# Patient Record
Sex: Female | Born: 1955 | Race: Black or African American | Hispanic: No | Smoking: Former smoker
Health system: Southern US, Community
[De-identification: ages and names within clinical notes are randomized; demographics above are authoritative.]

## PROBLEM LIST (undated history)

## (undated) DIAGNOSIS — M1990 Unspecified osteoarthritis, unspecified site: Secondary | ICD-10-CM

## (undated) DIAGNOSIS — I10 Essential (primary) hypertension: Secondary | ICD-10-CM

## (undated) HISTORY — PX: TUBAL LIGATION: SHX77

## (undated) HISTORY — DX: Unspecified osteoarthritis, unspecified site: M19.90

## (undated) HISTORY — DX: Essential (primary) hypertension: I10

---

## 1998-07-03 ENCOUNTER — Emergency Department (HOSPITAL_COMMUNITY): Admission: EM | Admit: 1998-07-03 | Discharge: 1998-07-03 | Payer: Self-pay | Admitting: Emergency Medicine

## 1999-02-16 ENCOUNTER — Emergency Department (HOSPITAL_COMMUNITY): Admission: EM | Admit: 1999-02-16 | Discharge: 1999-02-16 | Payer: Self-pay | Admitting: Emergency Medicine

## 1999-02-16 ENCOUNTER — Encounter: Payer: Self-pay | Admitting: Emergency Medicine

## 1999-03-02 ENCOUNTER — Encounter: Admission: RE | Admit: 1999-03-02 | Discharge: 1999-05-31 | Payer: Self-pay | Admitting: Family Medicine

## 1999-06-12 ENCOUNTER — Emergency Department (HOSPITAL_COMMUNITY): Admission: EM | Admit: 1999-06-12 | Discharge: 1999-06-12 | Payer: Self-pay | Admitting: Emergency Medicine

## 1999-09-03 ENCOUNTER — Other Ambulatory Visit: Admission: RE | Admit: 1999-09-03 | Discharge: 1999-09-03 | Payer: Self-pay | Admitting: Family Medicine

## 2000-05-15 ENCOUNTER — Emergency Department (HOSPITAL_COMMUNITY): Admission: EM | Admit: 2000-05-15 | Discharge: 2000-05-15 | Payer: Self-pay | Admitting: Emergency Medicine

## 2000-09-17 ENCOUNTER — Emergency Department (HOSPITAL_COMMUNITY): Admission: EM | Admit: 2000-09-17 | Discharge: 2000-09-17 | Payer: Self-pay | Admitting: Emergency Medicine

## 2000-09-17 ENCOUNTER — Encounter: Payer: Self-pay | Admitting: Emergency Medicine

## 2001-03-10 ENCOUNTER — Encounter: Payer: Self-pay | Admitting: Family Medicine

## 2001-03-10 ENCOUNTER — Encounter: Admission: RE | Admit: 2001-03-10 | Discharge: 2001-03-10 | Payer: Self-pay | Admitting: Family Medicine

## 2001-07-08 ENCOUNTER — Emergency Department (HOSPITAL_COMMUNITY): Admission: EM | Admit: 2001-07-08 | Discharge: 2001-07-08 | Payer: Self-pay | Admitting: Emergency Medicine

## 2001-10-02 ENCOUNTER — Emergency Department (HOSPITAL_COMMUNITY): Admission: EM | Admit: 2001-10-02 | Discharge: 2001-10-02 | Payer: Self-pay | Admitting: Emergency Medicine

## 2003-03-28 ENCOUNTER — Encounter: Payer: Self-pay | Admitting: Internal Medicine

## 2003-03-28 ENCOUNTER — Encounter: Admission: RE | Admit: 2003-03-28 | Discharge: 2003-03-28 | Payer: Self-pay | Admitting: Internal Medicine

## 2005-02-19 ENCOUNTER — Emergency Department (HOSPITAL_COMMUNITY): Admission: EM | Admit: 2005-02-19 | Discharge: 2005-02-19 | Payer: Self-pay | Admitting: Emergency Medicine

## 2005-12-17 ENCOUNTER — Emergency Department (HOSPITAL_COMMUNITY): Admission: EM | Admit: 2005-12-17 | Discharge: 2005-12-17 | Payer: Self-pay | Admitting: Emergency Medicine

## 2007-03-27 ENCOUNTER — Emergency Department (HOSPITAL_COMMUNITY): Admission: EM | Admit: 2007-03-27 | Discharge: 2007-03-27 | Payer: Self-pay | Admitting: Emergency Medicine

## 2007-10-23 ENCOUNTER — Encounter: Payer: Self-pay | Admitting: Family Medicine

## 2007-10-23 ENCOUNTER — Ambulatory Visit: Payer: Self-pay | Admitting: Family Medicine

## 2007-10-23 DIAGNOSIS — J309 Allergic rhinitis, unspecified: Secondary | ICD-10-CM

## 2007-10-23 DIAGNOSIS — M1990 Unspecified osteoarthritis, unspecified site: Secondary | ICD-10-CM | POA: Insufficient documentation

## 2007-10-23 DIAGNOSIS — I10 Essential (primary) hypertension: Secondary | ICD-10-CM

## 2007-10-23 LAB — CONVERTED CEMR LAB
ALT: 12 units/L (ref 0–35)
AST: 18 units/L (ref 0–37)
Albumin: 4.5 g/dL (ref 3.5–5.2)
Alkaline Phosphatase: 77 units/L (ref 39–117)
BUN: 8 mg/dL (ref 6–23)
CO2: 23 meq/L (ref 19–32)
Calcium: 9.4 mg/dL (ref 8.4–10.5)
Chloride: 105 meq/L (ref 96–112)
Cholesterol: 165 mg/dL (ref 0–200)
Creatinine, Ser: 0.78 mg/dL (ref 0.40–1.20)
Glucose, Bld: 96 mg/dL (ref 70–99)
HCT: 34.8 % — ABNORMAL LOW (ref 36.0–46.0)
HDL: 53 mg/dL (ref >39)
Hemoglobin: 11.3 g/dL — ABNORMAL LOW (ref 12.0–15.0)
LDL Cholesterol: 96 mg/dL (ref 0–99)
MCHC: 32.5 g/dL (ref 30.0–36.0)
MCV: 90.6 fL (ref 78.0–100.0)
Platelets: 378 10*3/uL (ref 150–400)
Potassium: 3.7 meq/L (ref 3.5–5.3)
RBC: 3.84 M/uL — ABNORMAL LOW (ref 3.87–5.11)
RDW: 13.2 % (ref 11.5–15.5)
Sodium: 141 meq/L (ref 135–145)
TSH: 1.226 microintl units/mL (ref 0.350–5.50)
Total Bilirubin: 0.6 mg/dL (ref 0.3–1.2)
Total CHOL/HDL Ratio: 3.1
Total Protein: 7.6 g/dL (ref 6.0–8.3)
Triglycerides: 81 mg/dL (ref <150)
VLDL: 16 mg/dL (ref 0–40)
WBC: 7 10*3/uL (ref 4.0–10.5)

## 2007-11-20 ENCOUNTER — Encounter (INDEPENDENT_AMBULATORY_CARE_PROVIDER_SITE_OTHER): Payer: Self-pay | Admitting: *Deleted

## 2008-02-02 ENCOUNTER — Encounter: Payer: Self-pay | Admitting: Family Medicine

## 2008-02-02 ENCOUNTER — Ambulatory Visit: Payer: Self-pay | Admitting: Family Medicine

## 2008-02-02 ENCOUNTER — Telehealth: Payer: Self-pay | Admitting: *Deleted

## 2008-02-02 LAB — CONVERTED CEMR LAB
Chlamydia, DNA Probe: NEGATIVE
GC Probe Amp, Genital: NEGATIVE
Whiff Test: POSITIVE

## 2008-02-04 ENCOUNTER — Encounter: Payer: Self-pay | Admitting: Family Medicine

## 2008-02-08 ENCOUNTER — Encounter: Payer: Self-pay | Admitting: Family Medicine

## 2008-03-31 ENCOUNTER — Ambulatory Visit: Payer: Self-pay | Admitting: Family Medicine

## 2008-08-15 ENCOUNTER — Ambulatory Visit: Payer: Self-pay | Admitting: Family Medicine

## 2008-08-25 ENCOUNTER — Encounter: Payer: Self-pay | Admitting: Family Medicine

## 2008-09-07 ENCOUNTER — Telehealth: Payer: Self-pay | Admitting: Family Medicine

## 2008-11-16 ENCOUNTER — Telehealth: Payer: Self-pay | Admitting: Family Medicine

## 2009-04-06 ENCOUNTER — Encounter: Payer: Self-pay | Admitting: Family Medicine

## 2009-04-06 ENCOUNTER — Ambulatory Visit: Payer: Self-pay | Admitting: Family Medicine

## 2009-09-04 ENCOUNTER — Ambulatory Visit: Payer: Self-pay | Admitting: Family Medicine

## 2009-10-02 ENCOUNTER — Telehealth: Payer: Self-pay | Admitting: Family Medicine

## 2009-11-24 ENCOUNTER — Encounter: Payer: Self-pay | Admitting: Family Medicine

## 2009-11-24 ENCOUNTER — Ambulatory Visit: Payer: Self-pay | Admitting: Family Medicine

## 2009-11-24 LAB — CONVERTED CEMR LAB
ALT: 13 units/L (ref 0–35)
AST: 16 units/L (ref 0–37)
Albumin: 4.7 g/dL (ref 3.5–5.2)
Alkaline Phosphatase: 94 units/L (ref 39–117)
BUN: 11 mg/dL (ref 6–23)
CO2: 25 meq/L (ref 19–32)
Calcium: 9.5 mg/dL (ref 8.4–10.5)
Chlamydia, DNA Probe: NEGATIVE
Chloride: 101 meq/L (ref 96–112)
Creatinine, Ser: 0.76 mg/dL (ref 0.40–1.20)
Direct LDL: 102 mg/dL — ABNORMAL HIGH
GC Probe Amp, Genital: NEGATIVE
Glucose, Bld: 83 mg/dL (ref 70–99)
Pap Smear: NEGATIVE
Potassium: 3.6 meq/L (ref 3.5–5.3)
Sodium: 138 meq/L (ref 135–145)
Total Bilirubin: 0.6 mg/dL (ref 0.3–1.2)
Total Protein: 7.8 g/dL (ref 6.0–8.3)
Whiff Test: POSITIVE

## 2009-11-28 ENCOUNTER — Encounter: Payer: Self-pay | Admitting: Family Medicine

## 2010-01-31 ENCOUNTER — Telehealth: Payer: Self-pay | Admitting: Family Medicine

## 2010-02-16 ENCOUNTER — Encounter: Payer: Self-pay | Admitting: Family Medicine

## 2010-07-17 NOTE — Progress Notes (Signed)
Summary: phn msg  Phone Note Call from Patient   Caller: Patient Summary of Call: Can't come in today due to children having virus. Initial call taken by: Clydell Hakim,  October 02, 2009 8:37 AM

## 2010-07-17 NOTE — Assessment & Plan Note (Signed)
Summary: cpe,tcb   Vital Signs:  Patient profile:   54 year old female Height:      62 inches Weight:      168.4 pounds BMI:     30.91 Pulse rate:   74 / minute BP sitting:   126 / 80  (right arm)  Vitals Entered By: Renato Battles slade,cma CC: fell on right knee 5 months ago. refill meds. f/up HTN Is Patient Diabetic? No Pain Assessment Patient in pain? yes     Location: right knee Intensity: 8 Onset of pain  x 5 months   Primary Care Provider:  Luretha Murphy NP  CC:  fell on right knee 5 months ago. refill meds. f/up HTN.  History of Present Illness: Pt here for health maintenance examination.  She feels well other than her right knee gives her pain when she goes up and down stairs.  She has a new boyfriend, she is very involved in her church.  Allergies have been bad, she has been using her nasal spray.  Habits & Providers  Alcohol-Tobacco-Diet     Tobacco Status: quit > 6 months     Tobacco Counseling: to remain off tobacco products  Current Medications (verified): 1)  Singulair 10 Mg  Tabs (Montelukast Sodium) .... Take One Tablet Daily 2)  Hyzaar 100-12.5 Mg  Tabs (Losartan Potassium-Hctz) .... Take One Tablet Daily 3)  Flonase 50 Mcg/act  Susp (Fluticasone Propionate) .... Two Squirts Daily 4)  Naprosyn 500 Mg Tabs (Naproxen) .... Take One Po Two Times A Day As Needed For Joint Pain  Allergies (verified): No Known Drug Allergies  Social History: Smoking Status:  quit > 6 months  Review of Systems General:  Complains of sweats; denies fatigue, loss of appetite, and sleep disorder. ENT:  Complains of sinus pressure. CV:  Denies chest pain or discomfort. Resp:  Denies cough and wheezing. GU:  Complains of discharge; denies dysuria. MS:  left knee pain. Psych:  Denies depression.  Physical Exam  General:  Alert, well developed AA female Eyes:  pupils equal, pupils round, pupils reactive to light, and no optic disk abnormalities.   Ears:  R ear normal and L  ear normal.   Nose:  no external erythema and no nasal discharge.   Mouth:  good dentition and posterior lymphoid hypertrophy.   Neck:  No deformities, masses, or tenderness noted. Breasts:  No mass, nodules, thickening, tenderness, bulging, retraction, inflamation, nipple discharge or skin changes noted.   Lungs:  normal respiratory effort and normal breath sounds.   Heart:  normal rate, regular rhythm, and no murmur.   Abdomen:  soft and non-tender.   Genitalia:  Normal introitus for age, no external lesions, no vaginal discharge, mucosa pink and moist, no vaginal or cervical lesions, no vaginal atrophy, no friaility or hemorrhage, normal uterus size and position, no adnexal masses or tenderness Msk:  normal ROM.  Right knee:  no effusion, tender along medial joint line, good stability.  Weak hamstrings Pulses:  R and L carotid,radial,femoral,dorsalis pedis and posterior tibial pulses are full and equal bilaterally Skin:  Intact without suspicious lesions or rashes Axillary Nodes:  No palpable lymphadenopathy Psych:  Cognition and judgment appear intact. Alert and cooperative with normal attention span and concentration. No apparent delusions, illusions, hallucinations   Impression & Recommendations:  Problem # 1:  PREVENTIVE HEALTH CARE (ICD-V70.0)  Prevention reviewed, patient given information to schedule her own mammogram and colonoscopy, Tdap given, PAP done.  Orders: [facility] - Est  40-64  yrs [phone])  Problem # 2:  CONTACT OR EXPOSURE TO OTHER VIRAL DISEASES (ICD-V01.79) New sexual partner Orders: GC/Chlamydia-[facility] ([id number]) Wet Prep- [facility] [phone]) HIV-[facility] [phone])  Problem # 3:  HYPERTENSION (ICD-401.9)  Her updated medication list for this problem includes:    Hyzaar 100-12.5 Mg Tabs (Losartan potassium-hctz) .Marland Kitchen... Take one tablet daily  Problem # 4:  ALLERGIC RHINITIS (ICD-477.9)  Her updated medication list for this problem includes:    Flonase 50 Mcg/act Susp  (Fluticasone propionate) .Marland Kitchen..Marland Kitchen Two squirts daily  Problem # 5:  NEED PROPHYLACTIC VACCINATION&INOCULATION FLU (ICD-V04.81) right knee pain:  recommended weight loss, NSAIDS, and stregthening exercises   Complete Medication List: 1)  Singulair 10 Mg Tabs (Montelukast sodium) .... Take one tablet daily 2)  Hyzaar 100-12.5 Mg Tabs (Losartan potassium-hctz) .... Take one tablet daily 3)  Flonase 50 Mcg/act Susp (Fluticasone propionate) .... Two squirts daily 4)  Naprosyn 500 Mg Tabs (Naproxen) .... Take one po two times a day as needed for joint pain  Other Orders: Direct LDL-[facility] [phone]) Comp Met-[facility] [phone]) Colonoscopy (Colon) Pap Smear-[facility] ([id number]) Tdap => 39yrs IM (70623) Admin 1st Vaccine (76283) Admin 1st Vaccine Horticulturist, commercial) [phone])  Patient Instructions: 1)  It is important to schedlue your colonoscopy and have your mammogram they are both screening tests for common cancers that we know if found early have a good results 2)  Exercise regularly 3)  Keep your weight down, try to lose 10 pounds in the next year by walking and eating healthy Prescriptions: NAPROSYN 500 MG TABS (NAPROXEN) take one po two times a day as needed for joint pain Brand medically necessary #100 x 3   Entered and Authorized by:   Luretha Murphy NP   Signed by:   Luretha Murphy NP on 11/24/2009   Method used:   Print then Give to Patient   RxID:   [id number] FLONASE 50 MCG/ACT  SUSP (FLUTICASONE PROPIONATE) two squirts daily Brand medically necessary #3 x 3   Entered and Authorized by:   Luretha Murphy NP   Signed by:   Luretha Murphy NP on 11/24/2009   Method used:   Print then Give to Patient   RxID:   [id number] HYZAAR 100-12.5 MG  TABS (LOSARTAN POTASSIUM-HCTZ) take one tablet daily Brand medically necessary #90 x 3   Entered and Authorized by:   Luretha Murphy NP   Signed by:   Luretha Murphy NP on 11/24/2009   Method used:   Print then Give to Patient   RxID:   [id number] SINGULAIR 10  MG  TABS (MONTELUKAST SODIUM) take one tablet daily Brand medically necessary #90 x 3   Entered and Authorized by:   Luretha Murphy NP   Signed by:   Luretha Murphy NP on 11/24/2009   Method used:   Print then Give to Patient   RxID:   [id number]    Prevention & Chronic Care Immunizations   Influenza vaccine: Fluvax Non-MCR  (04/06/2009)   Influenza vaccine due: 02/15/2010    Tetanus booster: 11/24/2009: Tdap    Pneumococcal vaccine: Not documented  Colorectal Screening   Hemoccult: Not documented   Hemoccult action/deferral: Not indicated  (11/24/2009)    Colonoscopy: Not documented   Colonoscopy action/deferral: GI Referral  (11/24/2009)  Other Screening   Pap smear: Normal  (02/08/2008)   Pap smear action/deferral: Ordered  (11/24/2009)   Pap smear due: 02/2009    Mammogram: Not documented   Smoking status: quit > 6 months  (11/24/2009)  Lipids   Total  Cholesterol: 165  (10/23/2007)   LDL: 96  (10/23/2007)   LDL Direct: Not documented   HDL: 53  (10/23/2007)   Triglycerides: 81  (10/23/2007)  Hypertension   Last Blood Pressure: 126 / 80  (11/24/2009)   Serum creatinine: 0.78  (10/23/2007)   Serum potassium 3.7  (10/23/2007) CMP ordered   Self-Management Support :    Hypertension self-management support: Not documented   Nursing Instructions: Screening colonoscopy ordered Pap smear today   Laboratory Results  Date/Time Received: November 24, 2009 10:18 AM  Date/Time Reported: November 24, 2009 10:38 AM   Wet [facility] Source: vag WBC/hpf: rare Bacteria/hpf: 2+  Cocci Clue cells/hpf: moderate  Positive whiff Yeast/hpf: none Trichomonas/hpf: none Comments: ...............test performed by......Marland KitchenBonnie A. Sw[place], MLS (ASCP)cm      Tetanus/Td Vaccine    Vaccine Type: Tdap    Site: left deltoid    Mfr: boostrix    Dose: 0.5 ml    Route: IM    Given by: Arlyss Repress CMA,    Exp. Date: 09/08/2011    Lot #: [id number]    VIS given: 05/05/07  version given November 24, 2009.

## 2010-07-17 NOTE — Letter (Signed)
Summary: Generic Letter  [facility]  [address]   [place], [place] [address]   Phone: [phone]  Fax: [phone]    11/28/2009  Melissa Lloyd [address] [place], [place]  [address]  Dear [name],   All of your test and labs are normal.       Sincerely,   Luretha Murphy NP  Appended Document: Generic Letter mailed.

## 2010-07-17 NOTE — Miscellaneous (Signed)
  Clinical Lists Changes  Medications: Rx of SINGULAIR 10 MG  TABS (MONTELUKAST SODIUM) take one tablet daily;  #30 x 6;  Signed;  Entered by: Luretha Murphy NP;  Authorized by: Luretha Murphy NP;  Method used: Print then Give to Patient Rx of HYZAAR 100-12.5 MG  TABS (LOSARTAN POTASSIUM-HCTZ) take one tablet daily;  #30 x 6;  Signed;  Entered by: Luretha Murphy NP;  Authorized by: Luretha Murphy NP;  Method used: Print then Give to Patient Rx of FLONASE 50 MCG/ACT  SUSP (FLUTICASONE PROPIONATE) two squirts daily;  #1 x 6;  Signed;  Entered by: Luretha Murphy NP;  Authorized by: Luretha Murphy NP;  Method used: Print then Give to Patient Rx of NAPROSYN 500 MG TABS (NAPROXEN) take one po two times a day as needed for joint pain;  #60 x 6;  Signed;  Entered by: Luretha Murphy NP;  Authorized by: Luretha Murphy NP;  Method used: Print then Give to Patient    Prescriptions: NAPROSYN 500 MG TABS (NAPROXEN) take one po two times a day as needed for joint pain  #60 x 6   Entered and Authorized by:   Luretha Murphy NP   Signed by:   Luretha Murphy NP on 02/16/2010   Method used:   Print then Give to Patient   RxID:   [id number] FLONASE 50 MCG/ACT  SUSP (FLUTICASONE PROPIONATE) two squirts daily  #1 x 6   Entered and Authorized by:   Luretha Murphy NP   Signed by:   Luretha Murphy NP on 02/16/2010   Method used:   Print then Give to Patient   RxID:   [id number] LKGMWN 100-12.5 MG  TABS (LOSARTAN POTASSIUM-HCTZ) take one tablet daily  #30 x 6   Entered and Authorized by:   Luretha Murphy NP   Signed by:   Luretha Murphy NP on 02/16/2010   Method used:   Print then Give to Patient   RxID:   [id number] SINGULAIR 10 MG  TABS (MONTELUKAST SODIUM) take one tablet daily  #30 x 6   Entered and Authorized by:   Luretha Murphy NP   Signed by:   Luretha Murphy NP on 02/16/2010   Method used:   Print then Give to Patient   RxID:   [id number]

## 2010-07-17 NOTE — Assessment & Plan Note (Signed)
Summary: sore throat,df   Vital Signs:  Patient profile:   55 year old female Weight:      172.6 pounds Temp:     98.3 degrees F oral Pulse rate:   96 / minute Pulse rhythm:   regular BP sitting:   152 / 92  Vitals Entered By: Loralee Pacas CMA (September 04, 2009 2:33 PM)  CC:  Sore Throat.  Acute Visit History:      The patient complains of sore throat.  These symptoms began 3 days ago.  She denies fever, nasal discharge, and rash.  Other comments include: grandson with strep. no other symptoms. no difficulty swallowing. .        She has had dysphagia associated with the sore throat.  There has been a recent exposure to strep.  There is no history of drooling or cold/URI symptoms.        Current Medications (verified): 1)  Singulair 10 Mg  Tabs (Montelukast Sodium) .... Take One Tablet Daily 2)  Hyzaar 100-12.5 Mg  Tabs (Losartan Potassium-Hctz) .... Take One Tablet Daily 3)  Flonase 50 Mcg/act  Susp (Fluticasone Propionate) .... Two Squirts Daily 4)  Naprosyn 500 Mg Tabs (Naproxen) .... Take One P0 Two Times A Day X 10 Days Then As Needed  Allergies (verified): No Known Drug Allergies  Physical Exam  General:  NAD, vitals reviewed.  Nose:  no rhinorrhea Mouth:  mild erythema of posterior oropharynx. no exudates.  Neck:  no lymphadenopathy Lungs:  Normal respiratory effort, chest expands symmetrically. Lungs are clear to auscultation, no crackles or wheezes.   Impression & Recommendations:  Problem # 1:  SORE THROAT (ICD-462) Assessment New  given strep exposure, will tx presumptively for strep throat. f/u as needed if symptoms don't improve.   Her updated medication list for this problem includes:    Naprosyn 500 Mg Tabs (Naproxen) .Marland Kitchen... Take one p0 two times a day x 10 days then as needed  Orders: [facility]- Est Level  3 ([address])  Patient Instructions: 1)  Schedule an appointment with Darl Pikes regarding your chronic problems, like your blood pressure.   Prescriptions: NAPROSYN 500 MG TABS (NAPROXEN) take one p0 two times a day x 10 days then as needed  #30 x 0   Entered and Authorized by:   Lequita Asal  MD   Signed by:   Lequita Asal  MD on 09/04/2009   Method used:   [facility] to        [facility] [phone]* (retail)       [address], [place]  [address]       Ph: [phone] or [phone]       Fax: [phone]   RxID:   [id number]   Appended Document: sore throat,df     Allergies: No Known Drug Allergies   Complete Medication List: 1)  Singulair 10 Mg Tabs (Montelukast sodium) .... Take one tablet daily 2)  Hyzaar 100-12.5 Mg Tabs (Losartan potassium-hctz) .... Take one tablet daily 3)  Flonase 50 Mcg/act Susp (Fluticasone propionate) .... Two squirts daily 4)  Naprosyn 500 Mg Tabs (Naproxen) .... Take one p0 two times a day x 10 days then as needed    Medication Administration  Injection # 1:    Medication: Bicillin LA 1.2 million units Injection    Diagnosis: SORE THROAT (ICD-462)    Route: IM    Site: RUOQ gluteus    Exp Date: 05/17/2012  Lot #: [id number]    Mfr: [employer]    Patient tolerated injection without complications    Given by: Loralee Pacas CMA (September 04, 2009 6:10 PM)

## 2010-07-17 NOTE — Progress Notes (Signed)
Summary: Rx Req  Phone Note Refill Request Call back at Home Phone [phone] Message from:  Patient  Refills Requested: Medication #1:  SINGULAIR 10 MG  TABS take one tablet daily [BMN]  Medication #2:  NAPROSYN 500 MG TABS take one po two times a day as needed for joint pain [BMN].  Medication #3:  HYZAAR 100-12.5 MG  TABS take one tablet daily [BMN]  Medication #4:  FLONASE 50 MCG/ACT  SUSP two squirts daily [BMN] PT HAS MISPLACED ALL HER SCRIPTS AND CAN SHE GET THESE MAILED TO HER.  Initial call taken by: Clydell Hakim,  January 31, 2010 12:25 PM    Prescriptions: NAPROSYN 500 MG TABS (NAPROXEN) take one po two times a day as needed for joint pain Brand medically necessary #100 x 3   Entered and Authorized by:   Zachery Dauer MD   Signed by:   Zachery Dauer MD on 01/31/2010   Method used:   Print then Give to Patient   RxID:   [id number] FLONASE 50 MCG/ACT  SUSP (FLUTICASONE PROPIONATE) two squirts daily Brand medically necessary #3 x 3   Entered and Authorized by:   Zachery Dauer MD   Signed by:   Zachery Dauer MD on 01/31/2010   Method used:   Print then Give to Patient   RxID:   [id number] SINGULAIR 10 MG  TABS (MONTELUKAST SODIUM) take one tablet daily Brand medically necessary #90 x 3   Entered and Authorized by:   Zachery Dauer MD   Signed by:   Zachery Dauer MD on 01/31/2010   Method used:   Print then Give to Patient   RxID:   [id number] HYZAAR 100-12.5 MG  TABS (LOSARTAN POTASSIUM-HCTZ) take one tablet daily Brand medically necessary #90 x 3   Entered and Authorized by:   Zachery Dauer MD   Signed by:   Zachery Dauer MD on 01/31/2010   Method used:   Print then Give to Patient   RxID:   [id number]   Appended Document: Rx Req placed in mail and patient notified.

## 2010-07-25 ENCOUNTER — Encounter: Payer: Self-pay | Admitting: *Deleted

## 2010-08-30 ENCOUNTER — Other Ambulatory Visit: Payer: Self-pay | Admitting: Family Medicine

## 2010-08-30 MED ORDER — FLUTICASONE PROPIONATE 50 MCG/ACT NA SUSP: 2.0000 | Freq: Every day | NASAL | Status: DC

## 2010-08-30 MED ORDER — LOSARTAN POTASSIUM-HCTZ 100-12.5 MG PO TABS: 1.0000 | ORAL_TABLET | Freq: Every day | ORAL | Status: DC

## 2010-08-30 MED ORDER — NAPROXEN 500 MG PO TABS: 500.0000 mg | ORAL_TABLET | Freq: Two times a day (BID) | ORAL | Status: DC | PRN

## 2010-08-30 MED ORDER — MONTELUKAST SODIUM 10 MG PO TABS: 10.0000 mg | ORAL_TABLET | Freq: Every day | ORAL | Status: DC

## 2010-08-30 NOTE — Telephone Encounter (Signed)
Mother here for son's visit and requesting refills.  Will fill for 1 month's worth. Mother told to make appointment with Darl Pikes within next 1-2 weeks.

## 2010-09-24 ENCOUNTER — Telehealth: Payer: Self-pay | Admitting: Family Medicine

## 2010-09-24 MED ORDER — LOSARTAN POTASSIUM-HCTZ 100-12.5 MG PO TABS: 1.0000 | ORAL_TABLET | Freq: Every day | ORAL | Status: DC

## 2010-09-24 NOTE — Telephone Encounter (Signed)
Was supposed to have meds refill and sent to [facility] and hasn't heard anything yet.  Not sure if they got sent in.  Patient is now out of her BP meds.

## 2010-09-24 NOTE — Telephone Encounter (Signed)
Will route to her PCP. 

## 2010-09-24 NOTE — Telephone Encounter (Signed)
[facility] [address] Will send in 3 months supply then needs a visit

## 2010-10-30 ENCOUNTER — Ambulatory Visit (INDEPENDENT_AMBULATORY_CARE_PROVIDER_SITE_OTHER): Admitting: Family Medicine

## 2010-10-30 ENCOUNTER — Encounter: Payer: Self-pay | Admitting: Family Medicine

## 2010-10-30 VITALS — BP 126/80 | HR 82 | Temp 98.0°F | Ht 61.0 in | Wt 166.0 lb

## 2010-10-30 DIAGNOSIS — A599 Trichomoniasis, unspecified: Secondary | ICD-10-CM | POA: Insufficient documentation

## 2010-10-30 DIAGNOSIS — N760 Acute vaginitis: Secondary | ICD-10-CM

## 2010-10-30 LAB — POCT WET PREP (WET MOUNT): Clue Cells Wet Prep HPF POC: NEGATIVE

## 2010-10-30 MED ORDER — FLUTICASONE PROPIONATE 50 MCG/ACT NA SUSP: 2.0000 | Freq: Every day | NASAL | Status: DC

## 2010-10-30 MED ORDER — MONTELUKAST SODIUM 10 MG PO TABS: 10.0000 mg | ORAL_TABLET | Freq: Every day | ORAL | Status: DC

## 2010-10-30 MED ORDER — LOSARTAN POTASSIUM-HCTZ 100-12.5 MG PO TABS: 1.0000 | ORAL_TABLET | Freq: Every day | ORAL | Status: DC

## 2010-10-30 MED ORDER — METRONIDAZOLE 500 MG PO TABS: ORAL_TABLET | ORAL | Status: DC

## 2010-10-30 MED ORDER — NAPROXEN 500 MG PO TABS: 500.0000 mg | ORAL_TABLET | Freq: Two times a day (BID) | ORAL | Status: DC | PRN

## 2010-10-30 NOTE — Assessment & Plan Note (Signed)
2 GM dose of metronidazole po, await GC and CT cultures

## 2010-10-30 NOTE — Progress Notes (Signed)
  Subjective:    Patient ID: Melissa Lloyd, female    DOB: [birth year], 55 y.o.   MRN: [id number]  HPI Large amount of vaginal discharge and itching.  One sexual partner, does not use condoms.   Review of Systems  Constitutional: Negative for fever and chills.  Gastrointestinal: Negative for abdominal distention.  Genitourinary: Positive for vaginal discharge. Negative for vaginal bleeding, vaginal pain and pelvic pain.       Objective:   Physical Exam  Genitourinary:       Copious amounts of green discharge in vaginal vault, + trich          Assessment & Plan:

## 2010-10-30 NOTE — Patient Instructions (Signed)
Please schedule Melissa Lloyd this Friday afternoon for a asthma visit

## 2010-10-31 ENCOUNTER — Encounter: Payer: Self-pay | Admitting: Family Medicine

## 2010-10-31 LAB — GC/CHLAMYDIA PROBE AMP, GENITAL: Chlamydia, DNA Probe: NEGATIVE

## 2010-11-06 ENCOUNTER — Encounter: Payer: Self-pay | Admitting: Family Medicine

## 2010-11-06 ENCOUNTER — Ambulatory Visit (INDEPENDENT_AMBULATORY_CARE_PROVIDER_SITE_OTHER): Admitting: Family Medicine

## 2010-11-06 VITALS — BP 134/88 | HR 91 | Temp 98.0°F | Ht 61.0 in | Wt 168.0 lb

## 2010-11-06 DIAGNOSIS — A599 Trichomoniasis, unspecified: Secondary | ICD-10-CM

## 2010-11-06 LAB — POCT WET PREP (WET MOUNT)
Trichomonas Wet Prep HPF POC: NEGATIVE
Yeast Wet Prep HPF POC: NEGATIVE

## 2010-11-06 MED ORDER — METRONIDAZOLE 500 MG PO TABS: ORAL_TABLET | ORAL | Status: AC
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
## 2010-11-06 NOTE — Patient Instructions (Signed)
Will retreat. 

## 2011-03-13 ENCOUNTER — Emergency Department (HOSPITAL_COMMUNITY)
Admission: EM | Admit: 2011-03-13 | Discharge: 2011-03-13 | Disposition: A | Discharge status: Home / Self Care | Attending: Emergency Medicine | Admitting: Emergency Medicine

## 2011-03-13 DIAGNOSIS — N898 Other specified noninflammatory disorders of vagina: Secondary | ICD-10-CM | POA: Insufficient documentation

## 2011-03-13 DIAGNOSIS — N72 Inflammatory disease of cervix uteri: Secondary | ICD-10-CM | POA: Insufficient documentation

## 2011-03-13 DIAGNOSIS — I10 Essential (primary) hypertension: Secondary | ICD-10-CM | POA: Insufficient documentation

## 2011-03-13 DIAGNOSIS — Z79899 Other long term (current) drug therapy: Secondary | ICD-10-CM | POA: Insufficient documentation

## 2011-03-13 DIAGNOSIS — R109 Unspecified abdominal pain: Secondary | ICD-10-CM | POA: Insufficient documentation

## 2011-03-13 DIAGNOSIS — R10819 Abdominal tenderness, unspecified site: Secondary | ICD-10-CM | POA: Insufficient documentation

## 2011-03-13 LAB — CBC
Hemoglobin: 12.7 g/dL (ref 12.0–15.0)
MCHC: 34 g/dL (ref 30.0–36.0)
RBC: 4.17 MIL/uL (ref 3.87–5.11)

## 2011-03-13 LAB — URINALYSIS, ROUTINE W REFLEX MICROSCOPIC
Ketones, ur: NEGATIVE mg/dL
Nitrite: NEGATIVE
Urobilinogen, UA: 1 mg/dL (ref 0.0–1.0)
pH: 5.5 (ref 5.0–8.0)

## 2011-03-13 LAB — URINE MICROSCOPIC-ADD ON

## 2011-03-13 LAB — DIFFERENTIAL
Basophils Absolute: 0 10*3/uL (ref 0.0–0.1)
Basophils Relative: 0 % (ref 0–1)
Monocytes Absolute: 1.3 10*3/uL — ABNORMAL HIGH (ref 0.1–1.0)
Neutro Abs: 8.3 10*3/uL — ABNORMAL HIGH (ref 1.7–7.7)
Neutrophils Relative %: 69 % (ref 43–77)

## 2011-03-13 LAB — WET PREP, GENITAL: Clue Cells Wet Prep HPF POC: NONE SEEN

## 2011-03-13 LAB — PREGNANCY, URINE: Preg Test, Ur: NEGATIVE

## 2011-03-13 LAB — POCT I-STAT, CHEM 8
Hemoglobin: 13.9 g/dL (ref 12.0–15.0)
Sodium: 136 mEq/L (ref 135–145)
TCO2: 26 mmol/L (ref 0–100)

## 2011-03-14 LAB — GC/CHLAMYDIA PROBE AMP, GENITAL
Chlamydia, DNA Probe: NEGATIVE
GC Probe Amp, Genital: NEGATIVE

## 2011-05-15 ENCOUNTER — Telehealth: Payer: Self-pay | Admitting: Family Medicine

## 2011-05-15 DIAGNOSIS — I10 Essential (primary) hypertension: Secondary | ICD-10-CM

## 2011-05-15 NOTE — Telephone Encounter (Signed)
[facility] - Fax [phone]/[place] - needs a refill of her BP medication.  She was told she would need an appointment to get this refill since is has been since 5/12 when she was last seen.  She will be coming in tomorrow.

## 2011-05-16 ENCOUNTER — Ambulatory Visit

## 2011-05-21 ENCOUNTER — Ambulatory Visit: Admitting: Family Medicine

## 2011-06-07 MED ORDER — LOSARTAN POTASSIUM-HCTZ 100-12.5 MG PO TABS: 1.0000 | ORAL_TABLET | Freq: Every day | ORAL | Status: DC

## 2011-06-07 NOTE — Telephone Encounter (Signed)
Has this been addressed?

## 2011-07-12 ENCOUNTER — Ambulatory Visit: Admitting: Family Medicine

## 2011-08-06 ENCOUNTER — Ambulatory Visit: Admitting: Family Medicine

## 2011-09-16 ENCOUNTER — Other Ambulatory Visit (HOSPITAL_COMMUNITY)
Admission: RE | Admit: 2011-09-16 | Discharge: 2011-09-16 | Disposition: A | Discharge status: Home / Self Care | Source: Ambulatory Visit | Attending: Family Medicine | Admitting: Family Medicine

## 2011-09-16 ENCOUNTER — Encounter: Payer: Self-pay | Admitting: Family Medicine

## 2011-09-16 ENCOUNTER — Ambulatory Visit (INDEPENDENT_AMBULATORY_CARE_PROVIDER_SITE_OTHER): Admitting: Family Medicine

## 2011-09-16 VITALS — BP 136/80 | HR 80 | Ht 61.0 in | Wt 168.4 lb

## 2011-09-16 DIAGNOSIS — Z01419 Encounter for gynecological examination (general) (routine) without abnormal findings: Secondary | ICD-10-CM | POA: Insufficient documentation

## 2011-09-16 DIAGNOSIS — I10 Essential (primary) hypertension: Secondary | ICD-10-CM

## 2011-09-16 DIAGNOSIS — Z1159 Encounter for screening for other viral diseases: Secondary | ICD-10-CM | POA: Insufficient documentation

## 2011-09-16 DIAGNOSIS — M1990 Unspecified osteoarthritis, unspecified site: Secondary | ICD-10-CM

## 2011-09-16 DIAGNOSIS — R5383 Other fatigue: Secondary | ICD-10-CM

## 2011-09-16 DIAGNOSIS — Z113 Encounter for screening for infections with a predominantly sexual mode of transmission: Secondary | ICD-10-CM | POA: Insufficient documentation

## 2011-09-16 DIAGNOSIS — Z202 Contact with and (suspected) exposure to infections with a predominantly sexual mode of transmission: Secondary | ICD-10-CM

## 2011-09-16 DIAGNOSIS — Z0000 Encounter for general adult medical examination without abnormal findings: Secondary | ICD-10-CM

## 2011-09-16 DIAGNOSIS — R5381 Other malaise: Secondary | ICD-10-CM

## 2011-09-16 DIAGNOSIS — N950 Postmenopausal bleeding: Secondary | ICD-10-CM

## 2011-09-16 LAB — CBC WITH DIFFERENTIAL/PLATELET
Basophils Absolute: 0 10*3/uL (ref 0.0–0.1)
Basophils Relative: 0 % (ref 0–1)
Eosinophils Absolute: 0.3 10*3/uL (ref 0.0–0.7)
Eosinophils Relative: 3 % (ref 0–5)
HCT: 40.2 % (ref 36.0–46.0)
Hemoglobin: 12.7 g/dL (ref 12.0–15.0)
Lymphocytes Relative: 30 % (ref 12–46)
Lymphs Abs: 2.5 10*3/uL (ref 0.7–4.0)
MCH: 29.3 pg (ref 26.0–34.0)
MCHC: 31.6 g/dL (ref 30.0–36.0)
MCV: 92.8 fL (ref 78.0–100.0)
Monocytes Absolute: 0.7 10*3/uL (ref 0.1–1.0)
Monocytes Relative: 9 % (ref 3–12)
Neutro Abs: 5 10*3/uL (ref 1.7–7.7)
Neutrophils Relative %: 59 % (ref 43–77)
Platelets: 359 10*3/uL (ref 150–400)
RBC: 4.33 MIL/uL (ref 3.87–5.11)
RDW: 13.2 % (ref 11.5–15.5)
WBC: 8.4 10*3/uL (ref 4.0–10.5)

## 2011-09-16 LAB — COMPREHENSIVE METABOLIC PANEL
ALT: 23 U/L (ref 0–35)
AST: 21 U/L (ref 0–37)
Alkaline Phosphatase: 80 U/L (ref 39–117)
CO2: 25 mEq/L (ref 19–32)
Sodium: 139 mEq/L (ref 135–145)
Total Bilirubin: 0.5 mg/dL (ref 0.3–1.2)
Total Protein: 7.8 g/dL (ref 6.0–8.3)

## 2011-09-16 LAB — HIV ANTIBODY (ROUTINE TESTING W REFLEX): HIV: NONREACTIVE

## 2011-09-16 MED ORDER — NAPROXEN 500 MG PO TABS: 500.0000 mg | ORAL_TABLET | Freq: Two times a day (BID) | ORAL | Status: DC

## 2011-09-16 MED ORDER — FLUTICASONE PROPIONATE 50 MCG/ACT NA SUSP: 2.0000 | Freq: Every day | NASAL | Status: DC

## 2011-09-16 MED ORDER — LOSARTAN POTASSIUM-HCTZ 100-12.5 MG PO TABS: 1.0000 | ORAL_TABLET | Freq: Every day | ORAL | Status: DC

## 2011-09-16 MED ORDER — MONTELUKAST SODIUM 10 MG PO TABS: 10.0000 mg | ORAL_TABLET | Freq: Every day | ORAL | Status: DC

## 2011-09-16 NOTE — Assessment & Plan Note (Addendum)
Continue losartan and hydrochlorothiazide 100/12.5 daily. Will check potassium Given that it was low in 2012. Will also check creatinine. If potassium is low consider testing supplements. Will plan on addressing diet and exercise at next visit.

## 2011-09-16 NOTE — Assessment & Plan Note (Signed)
Patient takes naproxen occasionally. Will check creatinine to make sure that NSAIDs are not affecting her kidney.

## 2011-09-16 NOTE — Progress Notes (Signed)
Patient ID: Melissa Lloyd, female   DOB: 05-13-1956, 56 y.o.   MRN: [id number] Patient ID: Melissa Lloyd    DOB: [birth year], 56 y.o.   MRN: [id number] --- Subjective:  Melissa Lloyd is a 56 y.o.female with h/o HTN and osteoarthritis who presents for physical exam. Concerns include the following - vaginal spotting in February. Only one day. Went through menopause in her late 54s. Denies any recent weight loss. Denies any family history of female cancers including uterine.  Menarche started at age 46. Does complain of fatigue. - History of sexually transmitted diseases: Had been screened for STDs in September 2012 because of concerns of sexually transmitted diseases from her partner. Had been positive for trichomoniasis at the time. Negative GC Chlamydia. Patient would like screening test for sexually transmitted diseases today. Denies any discharge, any vaginal itching or odor.She is still with the same partner but thinks that things have improved. - Hypertension: Takes her medication every day. Does not check her blood pressure home. Denies any chest pain shortness of breath lower extremity swelling or headache or change in vision.  Objective: Filed Vitals:   09/16/11 1048  BP: 136/80  Pulse: 80   Physical Examination:   General appearance - alert, well appearing, and in no distress Mouth - mucous membranes moist, pharynx normal without lesions Neck - supple, no significant adenopathy Chest - clear to auscultation, no wheezes, rales or rhonchi, symmetric air entry Heart - normal rate, regular rhythm, normal S1, S2, no murmurs, rubs, clicks or gallops Abdomen - soft, nontender, nondistended, no masses or organomegaly Extremities - peripheral pulses normal, no pedal edema, no clubbing or cyanosis Female genitalia: Vagina: normal appearing vagina with normal color and discharge, no lesions Cervix: blood present at os when brush was used for pap smear Uterus: uterus is normal size, shape, consistency and  nontender Adnexa: no masses and nontender

## 2011-09-16 NOTE — Patient Instructions (Addendum)
It was great meeting you today!   We are going to test blood work for sexually transmitted diseases, including HIV, syphillis, hepatitis B.   For the vaginal bleeding, like we talked about, I would like for you to get an "endometrial biopsy" to evaluate your bleeding further.   I will call you with the lab results.  We are also checking your potassium and kidney function since your blood pressure medications can cause low potassium.

## 2011-09-16 NOTE — Assessment & Plan Note (Signed)
Will call to get colonoscopy and will also schedule mammogram.

## 2011-09-16 NOTE — Assessment & Plan Note (Signed)
Will check RPR, HIV, hep B, herpes, GC Chlamydia given that patient has concerned about sexually transmitted diseases by her partner.

## 2011-09-16 NOTE — Assessment & Plan Note (Signed)
History of swollen day of spotting in a postmenopausal woman is concerning for endometrial cancer. Recommended to patient that she gets a followup appointment to get an endometrial biopsy. Patient agreed. On exam there was some bleeding with manipulation of the cervix. Followup on Pap results. Follow up on CBC to make sure that she is not anemic from this.

## 2011-09-17 LAB — HSV(HERPES SIMPLEX VRS) I + II AB-IGG: HSV 2 Glycoprotein G Ab, IgG: 7.43 IV — ABNORMAL HIGH

## 2011-09-20 ENCOUNTER — Telehealth: Payer: Self-pay | Admitting: Family Medicine

## 2011-09-20 NOTE — Telephone Encounter (Signed)
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 Explained that she doesn't have any evidence of active disease but that she at some point was infected with the virus. Advised for her to regularly check for lesions and come back to clinic to talk about treatment options. Currently no need to treat. Also informed her of negative HIV, RPR, Hep B and GC/Chl results. Patient understood.

## 2011-09-26 ENCOUNTER — Ambulatory Visit

## 2011-10-03 ENCOUNTER — Telehealth: Payer: Self-pay | Admitting: Family Medicine

## 2011-10-03 ENCOUNTER — Telehealth: Payer: Self-pay | Admitting: *Deleted

## 2011-10-03 NOTE — Telephone Encounter (Signed)
Called patient and left message to call our office back.  Mita Vallo Ann, RN  

## 2011-10-03 NOTE — Telephone Encounter (Signed)
Fwd. To PCP for refill. .Melissa Lloyd  

## 2011-10-03 NOTE — Telephone Encounter (Signed)
Needs to send [facility] by fax to fax # [phone] Needs to have her DOB & last 4 social 0430 pls resend the last 3 meds to them.

## 2011-10-03 NOTE — Telephone Encounter (Signed)
Pt called back and an appt was made in [facility] for endo bx on 5/9

## 2011-10-03 NOTE — Telephone Encounter (Signed)
Message copied by Jose Persia on Thu Oct 03, 2011 11:22 AM ------      Message from: Marena Chancy E      Created: Wed Oct 02, 2011  5:35 PM       Hi Lattie Corns,      Thank you for your message. If she is not coming to the appointments, it makes sense to make her a same day appointment. The only thing I am concerned about is that she does need an endometrial biopsy done with the procedure/women's health clinic and if we could make an exception for that appointment, that would be really great. But she might have already missed that appointment, in which case I would be glad to call her and remind her of the importance of making it to that appointment.       Sorry this is not as straight forward,      Thanks so much!      Judeth Cornfield

## 2011-10-04 ENCOUNTER — Other Ambulatory Visit: Payer: Self-pay | Admitting: Family Medicine

## 2011-10-04 DIAGNOSIS — I10 Essential (primary) hypertension: Secondary | ICD-10-CM

## 2011-10-04 DIAGNOSIS — M1990 Unspecified osteoarthritis, unspecified site: Secondary | ICD-10-CM

## 2011-10-04 MED ORDER — NAPROXEN 500 MG PO TABS: 500.0000 mg | ORAL_TABLET | Freq: Two times a day (BID) | ORAL | Status: DC | PRN

## 2011-10-04 MED ORDER — LOSARTAN POTASSIUM-HCTZ 100-12.5 MG PO TABS: 1.0000 | ORAL_TABLET | Freq: Every day | ORAL | Status: DC

## 2011-10-04 MED ORDER — MONTELUKAST SODIUM 10 MG PO TABS: 10.0000 mg | ORAL_TABLET | Freq: Every day | ORAL | Status: DC

## 2011-10-04 MED ORDER — FLUTICASONE PROPIONATE 50 MCG/ACT NA SUSP: 2.0000 | Freq: Every day | NASAL | Status: DC

## 2011-10-04 NOTE — Telephone Encounter (Signed)
Faxing prescriptions of fluticasone, hyzaar, singulair and naproxen to [facility] by fax to fax # [phone]

## 2011-10-07 NOTE — Telephone Encounter (Signed)
Fwd for refills.

## 2011-10-07 NOTE — Telephone Encounter (Signed)
Patient is calling because the pharmacy did not receive the faxed prescriptions.

## 2011-10-08 ENCOUNTER — Telehealth: Payer: Self-pay | Admitting: Family Medicine

## 2011-10-08 NOTE — Telephone Encounter (Signed)
Called patient to get phone number for [facility] [phone]. Faxed prescriptions on Monday April 22nd. Called [facility] and they told me that if the fax was sent Monday it would not have been processed yet. Need to call back on Wednesday or Thursday for status.    Also went over pap smear results with patient. Results show ASCUS without HPV. Given guidelines, this doesn't require any further testing other than repeat PAP smear in 2-3 years. Explained to patient that this could be secondary to cervical atrophy that comes with aging.   I also asked patient if she had scheduled endometrial biopsy and she said that she had. Emphasized importance of her to get this done in ruling out any possibility of endometrial cancer in the setting of vaginal spotting in postmenopausal woman.

## 2011-10-09 ENCOUNTER — Telehealth: Payer: Self-pay | Admitting: *Deleted

## 2011-10-09 NOTE — Telephone Encounter (Signed)
Patient was scheduled an appt in [facility]po clinic on 10/24/11 at 10:30am for endometrial bx.  Gaylene Brooks, RN

## 2011-10-09 NOTE — Telephone Encounter (Signed)
Message copied by Jose Persia on Wed Oct 09, 2011  9:10 AM ------      Message from: Marena Chancy E      Created: Wed Oct 02, 2011  5:35 PM       Hi Lattie Corns,      Thank you for your message. If she is not coming to the appointments, it makes sense to make her a same day appointment. The only thing I am concerned about is that she does need an endometrial biopsy done with the procedure/women's health clinic and if we could make an exception for that appointment, that would be really great. But she might have already missed that appointment, in which case I would be glad to call her and remind her of the importance of making it to that appointment.       Sorry this is not as straight forward,      Thanks so much!      Judeth Cornfield

## 2011-10-24 ENCOUNTER — Ambulatory Visit (INDEPENDENT_AMBULATORY_CARE_PROVIDER_SITE_OTHER): Admitting: Family Medicine

## 2011-10-24 VITALS — BP 122/72 | HR 60 | Temp 97.8°F | Wt 170.0 lb

## 2011-10-24 DIAGNOSIS — N950 Postmenopausal bleeding: Secondary | ICD-10-CM

## 2011-10-24 NOTE — Progress Notes (Signed)
Patient ID: Melissa Lloyd, female   DOB: 26-Sep-1955, 56 y.o.   MRN: [id number] Post menopausal bleeding. No pelvic pain or abdominal symptoms. Bleeding was one episode. Here for endometrial bio[psy. PERTINENT  PMH / PSH: Former smoker  When Dr Armen Pickup, Dr Elwyn Reach and I did the consent and explained teh procedure, teh patient did not want to procede with teh endometrial biopsy --and also did not want a pelvic US. Both dr Armen Pickup and I separately discussed this matter with her--I made sure she understood that  Her vaginal bleeding is ABNORMAL, and COULD be a symptom of cancer---either endometrial or cervical.  She says she fully understands and wants 'no part of" the workup.

## 2011-10-24 NOTE — Patient Instructions (Signed)
Melissa Lloyd,  Thank you very much for coming in to see [place] today. As we discussed, it is highly recommended that you have an endometrial biopsy and pelvic ultrasound for your abnormal uterine bleeding.  If you change your mind, please come back for the procedure and ultrasound.  Drs. Funches and [employer]

## 2012-08-05 ENCOUNTER — Ambulatory Visit (INDEPENDENT_AMBULATORY_CARE_PROVIDER_SITE_OTHER): Admitting: Family Medicine

## 2012-08-05 ENCOUNTER — Encounter: Payer: Self-pay | Admitting: Family Medicine

## 2012-08-05 ENCOUNTER — Other Ambulatory Visit (HOSPITAL_COMMUNITY)
Admission: RE | Admit: 2012-08-05 | Discharge: 2012-08-05 | Disposition: A | Discharge status: Home / Self Care | Source: Ambulatory Visit | Attending: Family Medicine | Admitting: Family Medicine

## 2012-08-05 VITALS — BP 142/82 | HR 75 | Temp 99.3°F | Ht 61.0 in | Wt 179.0 lb

## 2012-08-05 DIAGNOSIS — Z113 Encounter for screening for infections with a predominantly sexual mode of transmission: Secondary | ICD-10-CM | POA: Insufficient documentation

## 2012-08-05 DIAGNOSIS — M1990 Unspecified osteoarthritis, unspecified site: Secondary | ICD-10-CM

## 2012-08-05 DIAGNOSIS — I10 Essential (primary) hypertension: Secondary | ICD-10-CM

## 2012-08-05 LAB — POCT WET PREP (WET MOUNT)

## 2012-08-05 MED ORDER — LOSARTAN POTASSIUM-HCTZ 100-12.5 MG PO TABS: 1.0000 | ORAL_TABLET | Freq: Every day | ORAL | Status: DC

## 2012-08-05 MED ORDER — FLUTICASONE PROPIONATE 50 MCG/ACT NA SUSP: 2.0000 | Freq: Every day | NASAL | Status: DC

## 2012-08-05 MED ORDER — NAPROXEN 500 MG PO TABS: 500.0000 mg | ORAL_TABLET | Freq: Two times a day (BID) | ORAL | Status: DC | PRN

## 2012-08-05 MED ORDER — MONTELUKAST SODIUM 10 MG PO TABS: 10.0000 mg | ORAL_TABLET | Freq: Every day | ORAL | Status: DC

## 2012-08-05 NOTE — Patient Instructions (Addendum)
We will call or send a letter with results in 2-3 days off.

## 2012-08-05 NOTE — Progress Notes (Signed)
  Subjective:    Patient ID: Melissa Lloyd, female    DOB: 04-11-1956, 57 y.o.   MRN: [id number]  HPI  Patient presents to clinic to meet new physician and needs Med Refills.  Med Reconciliation completed by Drue Stager, PharmD candidate.  Possible exposure to STD:  Melissa Lloyd presents to clinic to discuss possible exposure to STD.  She was found to have Chlamydia and herpes in the past.  She has never had any herpetic lesions and was surprised when she found out she was positive for herpes.  She would like this to be checked again.  Additionally, she wants to be checked for HIV, RPR, Gonorrhea, Chlamydia, and wet prep.  Patient had sex with one partner in the last few months, but she says he has had Chlamydia in the past.  She had sex with this partner last week and condom broke.  Denies any vaginal bleeding, flank, pelvic pain.  Does endorse vaginal D/C.  Review of Systems Per HPI    Objective:   Physical Exam  Constitutional: She appears well-nourished. No distress.  Genitourinary: Uterus normal. There is no rash, tenderness or lesion on the right labia. There is no rash, tenderness or lesion on the left labia. Cervix exhibits discharge. Cervix exhibits no motion tenderness and no friability. Right adnexum displays no mass, no tenderness and no fullness. Left adnexum displays no mass, no tenderness and no fullness. Vaginal discharge found.      Assessment & Plan:

## 2012-08-06 ENCOUNTER — Encounter: Payer: Self-pay | Admitting: Family Medicine

## 2012-08-06 LAB — HIV ANTIBODY (ROUTINE TESTING W REFLEX): HIV: NONREACTIVE

## 2012-08-06 LAB — RPR

## 2012-08-06 NOTE — Assessment & Plan Note (Signed)
Will check RPR, HIV, and GC/chlamydia today.  Will send results letter.

## 2012-08-06 NOTE — Assessment & Plan Note (Addendum)
Will check wet prep, herpes 1 and 2.  Will notify of results.

## 2012-08-07 LAB — HERPES SIMPLEX VIRUS CULTURE: Organism ID, Bacteria: NOT DETECTED

## 2012-09-01 ENCOUNTER — Ambulatory Visit: Admitting: Family Medicine

## 2012-09-04 ENCOUNTER — Encounter: Payer: Self-pay | Admitting: Family Medicine

## 2012-09-04 ENCOUNTER — Ambulatory Visit (INDEPENDENT_AMBULATORY_CARE_PROVIDER_SITE_OTHER): Admitting: Family Medicine

## 2012-09-04 VITALS — BP 138/90 | HR 85 | Temp 97.9°F | Ht 61.0 in | Wt 179.4 lb

## 2012-09-04 DIAGNOSIS — H1045 Other chronic allergic conjunctivitis: Secondary | ICD-10-CM

## 2012-09-04 DIAGNOSIS — N760 Acute vaginitis: Secondary | ICD-10-CM

## 2012-09-04 DIAGNOSIS — H1011 Acute atopic conjunctivitis, right eye: Secondary | ICD-10-CM

## 2012-09-04 MED ORDER — METRONIDAZOLE 500 MG PO TABS: 500.0000 mg | ORAL_TABLET | Freq: Two times a day (BID) | ORAL | Status: DC

## 2012-09-04 MED ORDER — OLOPATADINE HCL 0.1 % OP SOLN: 1.0000 [drp] | Freq: Two times a day (BID) | OPHTHALMIC | Status: DC

## 2012-09-04 NOTE — Patient Instructions (Addendum)
Use anti-histamine drops to right eye twice per day until for 7 days. Continue cold compresses to RT eye to help decrease swelling and crusting. If symptoms do not improve in 7-10 days, please call your doctor or return to clinic.  Allergic Conjunctivitis A thin membrane (conjunctiva) covers the eyeball and underside of the eyelids. Allergic conjunctivitis happens when the thin membrane gets irritated from things like animal dander, pollen, perfumes, or smoke (allergens). The membrane may become puffy (swollen) and red. Small bumps may form on the inside of the eyelids. Your eyes may get teary, itchy, or burn. It cannot be passed to another person (contagious).  HOME CARE  Wash your hands before and after applying medicated drops or creams.  Do not touch the drop or cream tube to your eye or eyelids.  Do not use your soft contacts. Throw them away. Use a new pair once recovery is complete.  Do not use your hard contacts. They need to be washed (sterilized) thoroughly after recovery is complete.  Put a cold cloth to your eye(s) if you have itching and burning. GET HELP RIGHT AWAY IF:   You are not feeling better in 2 to 3 days after treatment.  Your lids are sticky or stick together.  Fluid comes from the eye(s).  You become sensitive to light.  You have a temperature by mouth above 102 F (38.9 C).  You have pain in and around the eye(s).  You start to have vision problems. MAKE SURE YOU:   Understand these instructions.  Will watch your condition.  Will get help right away if you are not doing well or get worse. Document Released: 11/21/2009 Document Revised: 08/26/2011 Document Reviewed: 11/21/2009 [facility] Patient Information 2013 [place], [place].

## 2012-09-04 NOTE — Progress Notes (Signed)
  Subjective:    Patient ID: Melissa Lloyd, female    DOB: Nov 02, 1955, 57 y.o.   MRN: [id number]  HPI  RT eye conjunctivitis x 2 weeks. Complains of associated itching and mild pain when she wakes up in AM. Redness was worse 2 weeks ago, but much better today. Used her son's eye drops that he was using for bacterial conjunctivitis with no relief. When she wakes up in AM, she does have some crusting of eyelid. She has been using cold compresses which is soothing. Denies any pus drainage. Denies any fever, chills, rhinorrhea, cough.  Review of Systems Per HPI    Objective:   Physical Exam  Constitutional: She appears well-nourished. No distress.  Eyes: EOM and lids are normal. Pupils are equal, round, and reactive to light. Right eye exhibits no discharge and no exudate. No foreign body present in the right eye. Right conjunctiva is injected.    Mild redness sclera inner eye (see photo); no pus drainage or crusting.      Assessment & Plan:

## 2012-09-04 NOTE — Assessment & Plan Note (Signed)
Based on history of eye itching and seasonal allergies, this is likely allergic conjunctivitis.  Sent anti-histamine drops to pharmacy x 7 days.   Continue warm or cold compresses.  If no improvement in 7-10 days or symptoms worsen, patient to return to clinic.

## 2012-09-04 NOTE — Assessment & Plan Note (Signed)
Last visit, patient found to have BV but was not treated.  Will send Flagyl to pharmacy.

## 2013-03-01 ENCOUNTER — Telehealth: Payer: Self-pay | Admitting: *Deleted

## 2013-03-01 DIAGNOSIS — M1990 Unspecified osteoarthritis, unspecified site: Secondary | ICD-10-CM

## 2013-03-01 DIAGNOSIS — J309 Allergic rhinitis, unspecified: Secondary | ICD-10-CM

## 2013-03-01 DIAGNOSIS — I10 Essential (primary) hypertension: Secondary | ICD-10-CM

## 2013-03-01 NOTE — Telephone Encounter (Signed)
Patient here today in office for son's appt and requesting her refills be faxed to [facility].  Patient states that she needs "all my meds refilled," especially BP med.  All Rxs faxed will need to have patient's SS # and DOB included.  Requesting 90-day supply.  Will take about 21 days to receive her mail order meds.  Rxs can be faxed to [phone].  Will route request to Dr. Adriana Simas.   Gaylene Brooks, RN

## 2013-03-02 MED ORDER — FLUTICASONE PROPIONATE 50 MCG/ACT NA SUSP: 2.0000 | Freq: Every day | NASAL | Status: DC

## 2013-03-02 MED ORDER — MONTELUKAST SODIUM 10 MG PO TABS: 10.0000 mg | ORAL_TABLET | Freq: Every day | ORAL | Status: DC

## 2013-03-02 MED ORDER — NAPROXEN 500 MG PO TABS: 500.0000 mg | ORAL_TABLET | Freq: Two times a day (BID) | ORAL | Status: DC | PRN

## 2013-03-02 MED ORDER — LOSARTAN POTASSIUM-HCTZ 100-12.5 MG PO TABS: 1.0000 | ORAL_TABLET | Freq: Every day | ORAL | Status: DC

## 2013-03-02 MED ORDER — OLOPATADINE HCL 0.1 % OP SOLN: 1.0000 [drp] | Freq: Two times a day (BID) | OPHTHALMIC | Status: DC

## 2013-03-02 NOTE — Telephone Encounter (Signed)
Wrote last 4 digits of SS # on Rxs and faxed to [facility].  Patient informed.  Gaylene Brooks, RN

## 2013-03-17 ENCOUNTER — Telehealth: Payer: Self-pay | Admitting: Family Medicine

## 2013-03-17 NOTE — Telephone Encounter (Signed)
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 Would like Dr. Adriana Simas to write a letter stating that she suffers from allergies that could prevent her from blowing into the machine properly. She would like it faxed to [facility] at [phone] with her name & DOB on fax cover. Please call patient with questions.

## 2013-03-18 NOTE — Telephone Encounter (Signed)
I do NOT feel comfortable doing this. I have not seen this patient previously and Her allergies should not interfere with her blowing into the device.

## 2013-03-18 NOTE — Telephone Encounter (Signed)
LVM for patient to call back. ?

## 2013-03-19 NOTE — Telephone Encounter (Signed)
Spoke with patient and gave below message. She states that the reason she needs this letter is because she cannot blow into the machine for the period of time it takes to unlock the ignition. She states that she has some paperwork discussing this and that it informed her that she needs to see PCP for this. She has just got the device put in and I informed her that SHE NEEDS TO BRING IN THE PAPERWORK STATING THIS!!!!

## 2013-03-24 ENCOUNTER — Ambulatory Visit (INDEPENDENT_AMBULATORY_CARE_PROVIDER_SITE_OTHER): Admitting: Family Medicine

## 2013-03-24 ENCOUNTER — Encounter: Payer: Self-pay | Admitting: Family Medicine

## 2013-03-24 VITALS — BP 126/47 | HR 80 | Temp 97.9°F | Ht 61.0 in | Wt 172.7 lb

## 2013-03-24 DIAGNOSIS — J309 Allergic rhinitis, unspecified: Secondary | ICD-10-CM

## 2013-03-24 NOTE — Progress Notes (Signed)
Subjective:     Patient ID: [name], female   DOB: January 11, 1956, 57 y.o.   MRN: [id number]  HPI 57 year old female presents for follow up.  Patient has a smartstart device on her vehicle from a prior DUI.  Recently, she has had difficulty blowing into the device and getting a reading.  She reports that due to her allergies, she blows into the device and subsequently coughs leading to an error in the reading.  She is requesting that I speak with the company regarding lowering the "breath requirement."  She has had no recent illness.  She reports frequent eye watering, post-nasal drainage, and sinus congestion/tenderness.   Review of Systems Per HPI    Objective:   Physical Exam Filed Vitals:   03/24/13 1119  BP: 126/47  Pulse: 80  Temp: 97.9 F (36.6 C)   Exam: General: well appearing female in NAD. HEENT: NCAT. No pharyngeal erythema.  No exudate.  Sinus tenderness on palpation. Cardiovascular: RRR. No murmurs, rubs, or gallops. Respiratory: CTAB. No rales, rhonchi, or wheeze.    Assessment:     57 year old female with allergic rhinitis.     Plan:     I spoke with [employer] today.  I was informed that if the patient requires decreasing the breath threshold, this has to be done through the court or the DMV.

## 2013-03-30 ENCOUNTER — Telehealth: Payer: Self-pay | Admitting: Family Medicine

## 2013-03-30 NOTE — Telephone Encounter (Signed)
Pt wanted to know why Dr. Adriana Simas will not write a letter stating that she can not blow into the machine to start her car. The allergies is what is causing her not to be able to start her car. Jw

## 2013-04-07 ENCOUNTER — Encounter: Payer: Self-pay | Admitting: Family Medicine

## 2013-04-07 ENCOUNTER — Ambulatory Visit (INDEPENDENT_AMBULATORY_CARE_PROVIDER_SITE_OTHER): Admitting: Family Medicine

## 2013-04-07 VITALS — BP 131/81 | HR 112 | Wt 172.0 lb

## 2013-04-07 DIAGNOSIS — J309 Allergic rhinitis, unspecified: Secondary | ICD-10-CM

## 2013-04-07 MED ORDER — BENZONATATE 100 MG PO CAPS: 100.0000 mg | ORAL_CAPSULE | Freq: Three times a day (TID) | ORAL | Status: DC | PRN

## 2013-04-07 NOTE — Progress Notes (Signed)
Subjective:     Patient ID: Melissa Lloyd, female   DOB: 05/10/1956, 57 y.o.   MRN: [id number]  HPI 57 year old female with allergic rhinitis presents for follow up.   Patient has still not resolved the issue regarding her smart start device.  She is requesting a letter today requesting that the "threshold" of the device be lowered.    Patient also complaining of hot flashes today.  She reports that they happen frequently.  She would like to discuss HRT today.   Review of Systems Per HPI    Objective:   Physical Exam Filed Vitals:   04/07/13 1047  BP: 131/81  Pulse: 112  Exam: General: well appearing, NAD. HEENT: NCAT. No pharyngeal erythema.    Assessment:     57 year old female with allergic rhinitis and hot flashes.     Plan:     Letter given to patient for lower breath threshold for smartstart device. Hot flashes - discussed risks/benefitis of HRT.  Patient did not want treatment at this time.

## 2013-06-22 ENCOUNTER — Other Ambulatory Visit: Payer: Self-pay | Admitting: Family Medicine

## 2013-07-14 ENCOUNTER — Encounter: Payer: Self-pay | Admitting: Family Medicine

## 2013-07-14 ENCOUNTER — Ambulatory Visit (INDEPENDENT_AMBULATORY_CARE_PROVIDER_SITE_OTHER): Admitting: Family Medicine

## 2013-07-14 VITALS — BP 139/84 | HR 67 | Temp 97.8°F | Ht 61.0 in | Wt 175.0 lb

## 2013-07-14 DIAGNOSIS — J111 Influenza due to unidentified influenza virus with other respiratory manifestations: Secondary | ICD-10-CM

## 2013-07-14 DIAGNOSIS — R69 Illness, unspecified: Principal | ICD-10-CM

## 2013-07-14 MED ORDER — OSELTAMIVIR PHOSPHATE 75 MG PO CAPS: 75.0000 mg | ORAL_CAPSULE | Freq: Two times a day (BID) | ORAL | Status: DC

## 2013-07-14 MED ORDER — OXYMETAZOLINE HCL 0.05 % NA SOLN: 1.0000 | Freq: Two times a day (BID) | NASAL | Status: DC | PRN

## 2013-07-14 NOTE — Assessment & Plan Note (Addendum)
Day 2 of illness consistent with flu. No flu vaccination, + sick contacts. Tx tamiflu. Afrin for sinus congestion symptoms, and tessalon for cough.

## 2013-07-14 NOTE — Progress Notes (Signed)
Patient ID: Jordan HawksRose M Rathke, female   DOB: 28-Nov-1955, 58 y.o.   MRN: [id number]   Subjective:  HPI:   Jordan HawksRose M Stavely is a 58 y.o. female presenting with 2 day history of influenza-like illness.   She reports subjective fever and chills associated with unproductive cough, muscle aches, sinus congestion for 2 days. Her sons have been diagnosed with the flu and are out of school. She declined the flu shot. She denies chest pain, shortness of breath, N/V/D. She endorses headaches and light-headedness. She has continued all her chronic medications, including singulair and flonase, but has tried no other remedies for this.   Review of Systems:  Per HPI. All other systems reviewed and are negative.    Past Medical History: Patient Active Problem List   Diagnosis Date Noted  . Postmenopausal bleeding 09/16/2011  . Healthcare maintenance 09/16/2011  . HYPERTENSION, BENIGN ESSENTIAL 10/23/2007  . ALLERGIC RHINITIS 10/23/2007  . OSTEOARTHRITIS 10/23/2007   Medications: reviewed and updated Current Outpatient Prescriptions  Medication Sig Dispense Refill  . benzonatate (TESSALON) 100 MG capsule Take 1 capsule (100 mg total) by mouth 3 (three) times daily as needed for cough.  30 capsule  0  . fluticasone (FLONASE) 50 MCG/ACT nasal spray Place 2 sprays into the nose daily.  16 g  11  . losartan-hydrochlorothiazide (HYZAAR) 100-12.5 MG per tablet Take 1 tablet by mouth daily.  90 tablet  3  . montelukast (SINGULAIR) 10 MG tablet Take 1 tablet (10 mg total) by mouth daily.  90 tablet  3  . naproxen (NAPROSYN) 500 MG tablet Take 1 tablet (500 mg total) by mouth 2 (two) times daily as needed. For joint pain  90 tablet  1  . olopatadine (PATANOL) 0.1 % ophthalmic solution Place 1 drop into the right eye 2 (two) times daily.  5 mL  3   No current facility-administered medications for this visit.    Objective:  Physical Exam: BP 139/84  Pulse 67  Temp(Src) 97.8 F (36.6 C) (Oral)  Ht 5\' 1"  (1.549  m)  Wt 175 lb (79.379 kg)  BMI 33.08 kg/m2  Gen:  58 y.o. female in NAD HEENT: MMM, anicteric sclerae, TMs normal bilaterally, turbinates boggy, + frontal and maxillary tenderness to percussion. + tender posterior cervical lymphadenopathy CV: RRR, no MRG, no JVD Resp: Non-labored, CTAB, no wheezes noted Abd: Soft, NTND, BS present, no guarding or organomegaly MSK: No edema noted, full ROM Neuro: Alert and oriented, speech normal    Assessment:     Jordan HawksRose M Capek is a 58 y.o. female here for influenza-like illness.     Plan:     See problem list for problem-specific plans.

## 2013-07-14 NOTE — Patient Instructions (Signed)
Thank you for coming in today!  You should take tamiflu for the next 5 days as directed. This will decrease the duration of your illness. Otherwise, treat symptoms with tessalon for cough, afrin for the next few days for sinus congestion, and tylenol for fever and body aches.  Seek immediate care if you experience chest pain, shortness of breath, or experience much worse symptoms than what you have now.  Please feel free to call with any questions or concerns at any time, at [phone]. - Dr. Jarvis NewcomerGrunz

## 2013-07-15 ENCOUNTER — Telehealth: Payer: Self-pay | Admitting: Family Medicine

## 2013-07-15 NOTE — Telephone Encounter (Signed)
Patient unable to afford Tamiflu. Will need something cheaper.

## 2013-07-16 NOTE — Telephone Encounter (Addendum)
Ms. Melissa Lloyd is now well outside the range of proven benefit from tamiflu and costs may outweigh benefits at this point. I recommend continued symptom management, with tessalon, afrin, and tylenol/ibuprofen in addition to excellent hand hygiene.   Could you please call and tell her this, and that if her symptoms get worse she should return for another evaluation.

## 2013-07-16 NOTE — Telephone Encounter (Signed)
Please forward this to Dr. Jarvis NewcomerGrunz who saw patient.

## 2013-07-16 NOTE — Telephone Encounter (Signed)
Unable to leave message,all number have no voice mail.they state voicemail is full. If patient calls back please get meProposito, Virgel BouquetGiovanna S

## 2013-08-07 ENCOUNTER — Encounter (HOSPITAL_COMMUNITY): Payer: Self-pay | Admitting: Emergency Medicine

## 2013-08-07 ENCOUNTER — Emergency Department (INDEPENDENT_AMBULATORY_CARE_PROVIDER_SITE_OTHER)
Admission: EM | Admit: 2013-08-07 | Discharge: 2013-08-07 | Disposition: A | Discharge status: Home / Self Care | Source: Home / Self Care

## 2013-08-07 ENCOUNTER — Other Ambulatory Visit (HOSPITAL_COMMUNITY)
Admission: RE | Admit: 2013-08-07 | Discharge: 2013-08-07 | Disposition: A | Discharge status: Home / Self Care | Source: Ambulatory Visit | Attending: Emergency Medicine | Admitting: Emergency Medicine

## 2013-08-07 DIAGNOSIS — Z113 Encounter for screening for infections with a predominantly sexual mode of transmission: Secondary | ICD-10-CM | POA: Insufficient documentation

## 2013-08-07 DIAGNOSIS — H1013 Acute atopic conjunctivitis, bilateral: Secondary | ICD-10-CM

## 2013-08-07 DIAGNOSIS — N949 Unspecified condition associated with female genital organs and menstrual cycle: Secondary | ICD-10-CM

## 2013-08-07 DIAGNOSIS — R102 Pelvic and perineal pain: Secondary | ICD-10-CM

## 2013-08-07 DIAGNOSIS — R300 Dysuria: Secondary | ICD-10-CM

## 2013-08-07 DIAGNOSIS — H1045 Other chronic allergic conjunctivitis: Secondary | ICD-10-CM

## 2013-08-07 DIAGNOSIS — N760 Acute vaginitis: Secondary | ICD-10-CM | POA: Insufficient documentation

## 2013-08-07 LAB — POCT URINALYSIS DIP (DEVICE)
Bilirubin Urine: NEGATIVE
Glucose, UA: NEGATIVE mg/dL
Ketones, ur: NEGATIVE mg/dL
NITRITE: NEGATIVE
Protein, ur: NEGATIVE mg/dL
Specific Gravity, Urine: <=1.005 (ref 1.005–1.030)
UROBILINOGEN UA: 0.2 mg/dL (ref 0.0–1.0)
pH: 5.5 (ref 5.0–8.0)

## 2013-08-07 MED ORDER — KETOTIFEN FUMARATE 0.025 % OP SOLN: 1.0000 [drp] | Freq: Two times a day (BID) | OPHTHALMIC | Status: DC

## 2013-08-07 MED ORDER — METRONIDAZOLE 500 MG PO TABS: 500.0000 mg | ORAL_TABLET | Freq: Two times a day (BID) | ORAL | Status: DC

## 2013-08-07 MED ORDER — CIPROFLOXACIN HCL 500 MG PO TABS: 500.0000 mg | ORAL_TABLET | Freq: Two times a day (BID) | ORAL | Status: DC

## 2013-08-07 NOTE — Discharge Instructions (Signed)
Dysuria Dysuria is the medical term for pain with urination. There are many causes for dysuria, but urinary tract infection is the most common. If a urinalysis was performed it can show that there is a urinary tract infection. A urine culture confirms that you or your child is sick. You will need to follow up with a healthcare provider because:  If a urine culture was done you will need to know the culture results and treatment recommendations.  If the urine culture was positive, you or your child will need to be put on antibiotics or know if the antibiotics prescribed are the right antibiotics for your urinary tract infection.  If the urine culture is negative (no urinary tract infection), then other causes may need to be explored or antibiotics need to be stopped. Today laboratory work may have been done and there does not seem to be an infection. If cultures were done they will take at least 24 to 48 hours to be completed. Today x-rays may have been taken and they read as normal. No cause can be found for the problems. The x-rays may be re-read by a radiologist and you will be contacted if additional findings are made. You or your child may have been put on medications to help with this problem until you can see your primary caregiver. If the problems get better, see your primary caregiver if the problems return. If you were given antibiotics (medications which kill germs), take all of the mediations as directed for the full course of treatment.  If laboratory work was done, you need to find the results. Leave a telephone number where you can be reached. If this is not possible, make sure you find out how you are to get test results. HOME CARE INSTRUCTIONS   Drink lots of fluids. For adults, drink eight, 8 ounce glasses of clear juice or water a day. For children, replace fluids as suggested by your caregiver.  Empty the bladder often. Avoid holding urine for long periods of time.  After a bowel  movement, women should cleanse front to back, using each tissue only once.  Empty your bladder before and after sexual intercourse.  Take all the medicine given to you until it is gone. You may feel better in a few days, but TAKE ALL MEDICINE.  Avoid caffeine, tea, alcohol and carbonated beverages, because they tend to irritate the bladder.  In men, alcohol may irritate the prostate.  Only take over-the-counter or prescription medicines for pain, discomfort, or fever as directed by your caregiver.  If your caregiver has given you a follow-up appointment, it is very important to keep that appointment. Not keeping the appointment could result in a chronic or permanent injury, pain, and disability. If there is any problem keeping the appointment, you must call back to this facility for assistance. SEEK IMMEDIATE MEDICAL CARE IF:   Back pain develops.  A fever develops.  There is nausea (feeling sick to your stomach) or vomiting (throwing up).  Problems are no better with medications or are getting worse. MAKE SURE YOU:   Understand these instructions.  Will watch your condition.  Will get help right away if you are not doing well or get worse. Document Released: 03/01/2004 Document Revised: 08/26/2011 Document Reviewed: 01/07/2008 [facility] Patient Information 2014 [place], [place].  Pelvic Pain Pelvic pain is pain felt below the belly button and between your hips. It can be caused by many different things. It is important to get help right away. This  is especially true for severe, sharp, or unusual pain that comes on suddenly.  HOME CARE  Only take medicine as told by your doctor.  Rest as told by your doctor.  Eat a healthy diet, such as fruits, vegetables, and lean meats.  Drink enough fluids to keep your pee (urine) clear or pale yellow, or as told.  Avoid sex (intercourse) if it causes pain.  Apply warm or cold packs to your lower belly (abdomen). Use the type of pack  that helps the pain.  Avoid situations that cause you stress.  Keep a journal to track your pain. Write down:  When the pain started.  Where it is located.  If there are things that seem to be related to the pain, such as food or your period.  Follow up with your doctor as told. GET HELP RIGHT AWAY IF:   You have heavy bleeding from the vagina.  You have more pelvic pain.  You feel lightheaded or pass out (faint).  You have chills.  You have pain when you pee or have blood in your pee.  You cannot stop having watery poop (diarrhea).  You cannot stop throwing up (vomiting).  You have a fever or lasting symptoms for more than 3 days.  You have a fever and your symptoms suddenly get worse.  You are being physically or sexually abused.  Your medicine does not help your pain.  You have fluid (discharge) coming from your vagina that is not normal. MAKE SURE YOU:  Understand these instructions.  Will watch your condition.  Will get help if you are not doing well or get worse. Document Released: 11/20/2007 Document Revised: 12/03/2011 Document Reviewed: 09/23/2011 [facility] Patient Information 2014 [place], [place].  Allergic Conjunctivitis The conjunctiva is a thin membrane that covers the visible white part of the eyeball and the underside of the eyelids. This membrane protects and lubricates the eye. The membrane has small blood vessels running through it that can normally be seen. When the conjunctiva becomes inflamed, the condition is called conjunctivitis. In response to the inflammation, the conjunctival blood vessels become swollen. The swelling results in redness in the normally white part of the eye. The blood vessels of this membrane also react when a person has allergies and is then called allergic conjunctivitis. This condition usually lasts for as long as the allergy persists. Allergic conjunctivitis cannot be passed to another person (non-contagious). The  likelihood of bacterial infection is great and the cause is not likely due to allergies if the inflamed eye has:  A sticky discharge.  Discharge or sticking together of the lids in the morning.  Scaling or flaking of the eyelids where the eyelashes come out.  Red swollen eyelids. CAUSES   Viruses.  Irritants such as foreign bodies.  Chemicals.  General allergic reactions.  Inflammation or serious diseases in the inside or the outside of the eye or the orbit (the boney cavity in which the eye sits) can cause a "red eye." SYMPTOMS   Eye redness.  Tearing.  Itchy eyes.  Burning feeling in the eyes.  Clear drainage from the eye.  Allergic reaction due to pollens or ragweed sensitivity. Seasonal allergic conjunctivitis is frequent in the spring when pollens are in the air and in the fall. DIAGNOSIS  This condition, in its many forms, is usually diagnosed based on the history and an ophthalmological exam. It usually involves both eyes. If your eyes react at the same time every year, allergies may be the  cause. While most "red eyes" are due to allergy or an infection, the role of an eye (ophthalmological) exam is important. The exam can rule out serious diseases of the eye or orbit. TREATMENT   Non-antibiotic eye drops, ointments, or medications by mouth may be prescribed if the ophthalmologist is sure the conjunctivitis is due to allergies alone.  Over-the-counter drops and ointments for allergic symptoms should be used only after other causes of conjunctivitis have been ruled out, or as your caregiver suggests. Medications by mouth are often prescribed if other allergy-related symptoms are present. If the ophthalmologist is sure that the conjunctivitis is due to allergies alone, treatment is normally limited to drops or ointments to reduce itching and burning. HOME CARE INSTRUCTIONS   Wash hands before and after applying drops or ointments, or touching the inflamed eye(s) or  eyelids.  Do not let the eye dropper tip or ointment tube touch the eyelid when putting medicine in your eye.  Stop using your soft contact lenses and throw them away. Use a new pair of lenses when recovery is complete. You should run through sterilizing cycles at least three times before use after complete recovery if the old soft contact lenses are to be used. Hard contact lenses should be stopped. They need to be thoroughly sterilized before use after recovery.  Itching and burning eyes due to allergies is often relieved by using a cool cloth applied to closed eye(s). SEEK MEDICAL CARE IF:   Your problems do not go away after two or three days of treatment.  Your lids are sticky (especially in the morning when you wake up) or stick together.  Discharge develops. Antibiotics may be needed either as drops, ointment, or by mouth.  You have extreme light sensitivity.  An oral temperature above 102 F (38.9 C) develops.  Pain in or around the eye or any other visual symptom develops. MAKE SURE YOU:   Understand these instructions.  Will watch your condition.  Will get help right away if you are not doing well or get worse. Document Released: 08/24/2002 Document Revised: 08/26/2011 Document Reviewed: 07/20/2007 [facility] Patient Information 2014 [place], [place].

## 2013-08-07 NOTE — ED Notes (Signed)
C/o abd pain states she does have abnormal bleeding now.  States she does have some pressure when urinating, burning and frequency.  States she is experiencing diarrhea but thought it was coming from the cranberry juice she is drinking.  Denies any vomiting or nausea.  Does admit to having new partner so would like to be check for std

## 2013-08-07 NOTE — ED Provider Notes (Signed)
Medical screening examination/treatment/procedure(s) were performed by non-physician practitioner and as supervising physician I was immediately available for consultation/collaboration.  Aarushi Hemric, M.D.  Noya Santarelli C Zoie Sarin, MD 08/07/13 1011 
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
## 2013-08-09 LAB — CERVICOVAGINAL ANCILLARY ONLY
Chlamydia: NEGATIVE
Neisseria Gonorrhea: NEGATIVE
Wet Prep (BD Affirm): NEGATIVE
Wet Prep (BD Affirm): NEGATIVE
Wet Prep (BD Affirm): POSITIVE — AB

## 2013-08-09 NOTE — ED Notes (Signed)
GC/Chlamydia neg., Affirm: Candida and Trich neg., Gardnerella pos.  Pt. adequately treated with Flagyl. Vassie MoselleYork, Junia Nygren M 08/09/2013

## 2013-11-15 ENCOUNTER — Telehealth: Payer: Self-pay | Admitting: Family Medicine

## 2013-11-15 DIAGNOSIS — I10 Essential (primary) hypertension: Secondary | ICD-10-CM

## 2013-11-15 NOTE — Telephone Encounter (Signed)
Patient needs on HCTZ refilled. Would like a month supply sent to [facility] and all other refills sent to [facility]. Patient states it take about 3 weeks to get meds and she will be out before then. Please advise.

## 2013-11-16 MED ORDER — LOSARTAN POTASSIUM-HCTZ 100-12.5 MG PO TABS: 1.0000 | ORAL_TABLET | Freq: Every day | ORAL | Status: DC

## 2013-11-16 NOTE — Telephone Encounter (Signed)
Rx for Losartan HCTZ sent.

## 2014-05-17 ENCOUNTER — Encounter: Payer: Self-pay | Admitting: Family Medicine

## 2014-05-17 ENCOUNTER — Ambulatory Visit (INDEPENDENT_AMBULATORY_CARE_PROVIDER_SITE_OTHER): Admitting: Family Medicine

## 2014-05-17 VITALS — BP 128/50 | HR 86 | Ht 61.0 in | Wt 170.2 lb

## 2014-05-17 DIAGNOSIS — I10 Essential (primary) hypertension: Secondary | ICD-10-CM

## 2014-05-17 DIAGNOSIS — J309 Allergic rhinitis, unspecified: Secondary | ICD-10-CM

## 2014-05-17 DIAGNOSIS — J069 Acute upper respiratory infection, unspecified: Secondary | ICD-10-CM

## 2014-05-17 DIAGNOSIS — Z0000 Encounter for general adult medical examination without abnormal findings: Secondary | ICD-10-CM

## 2014-05-17 MED ORDER — OLOPATADINE HCL 0.1 % OP SOLN: 1.0000 [drp] | Freq: Two times a day (BID) | OPHTHALMIC | Status: DC

## 2014-05-17 MED ORDER — LOSARTAN POTASSIUM-HCTZ 100-12.5 MG PO TABS: 1.0000 | ORAL_TABLET | Freq: Every day | ORAL | Status: DC

## 2014-05-17 MED ORDER — MONTELUKAST SODIUM 10 MG PO TABS: 10.0000 mg | ORAL_TABLET | Freq: Every day | ORAL | Status: DC

## 2014-05-17 MED ORDER — FLUTICASONE PROPIONATE 50 MCG/ACT NA SUSP: 2.0000 | Freq: Every day | NASAL | Status: DC

## 2014-05-17 NOTE — Progress Notes (Signed)
   Subjective:    Patient ID: Melissa Lloyd, female    DOB: 12/16/1955, 58 y.o.   MRN: [id number]  HPI 58 year old female with HTN and allergic rhinitis presents for follow up.  Current concerns/issues:  1) Concern for influenza  Patient reports that recently she has been experiencing head and back ache, and sore throat/earache.  She is concerned that she might have the flu.  No recent fever. +/- chills.  No reports of increasing fatigue or diffuse myalgias.  + sick contacts (grandchildren).  2) Preventative health care  Patient need of mammogram and influenza vaccine.  Patient also in need of colonoscopy.  Patient requesting medication refill today.  3) HTN  Home BP Monitoring - None  Medications - losartan/HCTZ  Compliance -  Yes.  ROS: Denies chest pain, SOB, lightheadedness/dizziness   Review of Systems Per HPI    Objective:   Physical Exam Filed Vitals:   05/17/14 1557  BP: 128/50  Pulse: 86   Exam: General: well appearing, NAD. HEENT: NCAT. Normal TM's bilaterally. Oropharynx clear. Cardiovascular: RRR. No murmurs, rubs, or gallops. Respiratory: CTAB. No rales, rhonchi, or wheeze. Abdomen: soft, nontender, nondistended.     Assessment & Plan:  See problem list

## 2014-05-18 DIAGNOSIS — J069 Acute upper respiratory infection, unspecified: Secondary | ICD-10-CM | POA: Insufficient documentation

## 2014-05-18 NOTE — Assessment & Plan Note (Signed)
Well-controlled. Will continue losartan/HCTZ.

## 2014-05-18 NOTE — Assessment & Plan Note (Signed)
Patient symptoms likely secondary to viral URI, especially given sick contacts (grandchildren). Patient reassured that she likely does not have influenza. Advised continued use of Flonase.

## 2014-05-18 NOTE — Assessment & Plan Note (Signed)
Patient declined influenza vaccine today. Handout regarding mammogram given today. Patient declined colonoscopy stating that she will never get one.  Suggest a stool cards and patient is in agreement.

## 2014-07-28 ENCOUNTER — Other Ambulatory Visit (HOSPITAL_COMMUNITY)
Admission: RE | Admit: 2014-07-28 | Discharge: 2014-07-28 | Disposition: A | Discharge status: Home / Self Care | Source: Ambulatory Visit | Attending: Family Medicine | Admitting: Family Medicine

## 2014-07-28 ENCOUNTER — Ambulatory Visit (INDEPENDENT_AMBULATORY_CARE_PROVIDER_SITE_OTHER): Admitting: Family Medicine

## 2014-07-28 ENCOUNTER — Encounter: Payer: Self-pay | Admitting: Family Medicine

## 2014-07-28 VITALS — BP 140/91 | HR 74 | Temp 97.7°F | Wt 174.0 lb

## 2014-07-28 DIAGNOSIS — L298 Other pruritus: Secondary | ICD-10-CM

## 2014-07-28 DIAGNOSIS — Z113 Encounter for screening for infections with a predominantly sexual mode of transmission: Secondary | ICD-10-CM | POA: Diagnosis present

## 2014-07-28 DIAGNOSIS — N898 Other specified noninflammatory disorders of vagina: Secondary | ICD-10-CM

## 2014-07-28 LAB — POCT URINALYSIS DIPSTICK
Bilirubin, UA: NEGATIVE
Blood, UA: NEGATIVE
Glucose, UA: NEGATIVE
Ketones, UA: NEGATIVE
Nitrite, UA: NEGATIVE
Protein, UA: NEGATIVE
Spec Grav, UA: 1.015
Urobilinogen, UA: 0.2
pH, UA: 5.5

## 2014-07-28 LAB — POCT WET PREP (WET MOUNT): Clue Cells Wet Prep Whiff POC: POSITIVE

## 2014-07-28 LAB — POCT UA - MICROSCOPIC ONLY: Epithelial cells, urine per micros: >20

## 2014-07-28 MED ORDER — METRONIDAZOLE 500 MG PO TABS: 500.0000 mg | ORAL_TABLET | Freq: Two times a day (BID) | ORAL | Status: DC

## 2014-07-29 DIAGNOSIS — N898 Other specified noninflammatory disorders of vagina: Secondary | ICD-10-CM | POA: Insufficient documentation

## 2014-07-29 LAB — GC/CHLAMYDIA PROBE AMP (~~LOC~~) NOT AT ARMC
Chlamydia: NEGATIVE
NEISSERIA GONORRHEA: NEGATIVE

## 2014-07-29 NOTE — Progress Notes (Signed)
   Subjective:    Patient ID: Melissa Lloyd, female    DOB: 1955-11-19, 59 y.o.   MRN: [id number]  Seen for Same day visit for   CC: vaginal itching  She reports vaginal itching x 2-3 days. Denies fevers, chills, vaginal pain/discharge/lesions. She has had sexual intercourse in the past few months but reports consistent condom use. She associates her symptoms to her Hx of ovarian cyst - reports being treated several times of BV and yeast. Denies periods in > 1 year. She also reports vaginal dryness s/p menopause.   Review of Systems   See HPI for ROS. Objective:  BP 140/91 mmHg  Pulse 74  Temp(Src) 97.7 F (36.5 C) (Oral)  Wt 174 lb (78.926 kg)  General: NAD Cardiac: RRR, normal heart sounds, no murmurs. 2+ radial and PT pulses bilaterally Respiratory: CTAB, normal effort Abdomen: soft, nontender, nondistended, no hepatic or splenomegaly. Bowel sounds present Speculum Exam: Ext genitalia: wnl; Vaginal discharge: thin/white; Cervix: wnl. No Vaginal wall defects    Assessment & Plan:  See Problem List Documentation

## 2014-07-29 NOTE — Assessment & Plan Note (Signed)
Wetprep + for BV - Flagyl x 1 week - Symptoms could also be related to vaginal atrophy s/p menopause. Vaginal tissue health appearing without noticeable sclerosis, but trail of vaginal estrogen cream could be beneficial if symptoms not resolved after abx

## 2014-08-03 ENCOUNTER — Other Ambulatory Visit: Payer: Self-pay | Admitting: Family Medicine

## 2014-08-03 MED ORDER — FLUCONAZOLE 150 MG PO TABS: 150.0000 mg | ORAL_TABLET | Freq: Once | ORAL | Status: DC

## 2014-08-03 MED ORDER — METRONIDAZOLE 500 MG PO TABS: 500.0000 mg | ORAL_TABLET | Freq: Two times a day (BID) | ORAL | Status: DC

## 2014-10-05 ENCOUNTER — Other Ambulatory Visit: Payer: Self-pay | Admitting: Family Medicine

## 2014-10-05 MED ORDER — OLOPATADINE HCL 0.1 % OP SOLN: 1.0000 [drp] | Freq: Two times a day (BID) | OPHTHALMIC | Status: DC

## 2014-11-08 ENCOUNTER — Ambulatory Visit: Admitting: Family Medicine

## 2014-11-10 ENCOUNTER — Ambulatory Visit: Admitting: Family Medicine

## 2014-11-22 ENCOUNTER — Ambulatory Visit: Admitting: Family Medicine

## 2015-02-12 ENCOUNTER — Emergency Department (HOSPITAL_COMMUNITY)

## 2015-02-12 ENCOUNTER — Encounter (HOSPITAL_COMMUNITY): Payer: Self-pay | Admitting: Emergency Medicine

## 2015-02-12 ENCOUNTER — Emergency Department (HOSPITAL_COMMUNITY)
Admission: EM | Admit: 2015-02-12 | Discharge: 2015-02-13 | Disposition: A | Discharge status: Home / Self Care | Attending: Emergency Medicine | Admitting: Emergency Medicine

## 2015-02-12 DIAGNOSIS — S80211A Abrasion, right knee, initial encounter: Secondary | ICD-10-CM | POA: Diagnosis not present

## 2015-02-12 DIAGNOSIS — Z87891 Personal history of nicotine dependence: Secondary | ICD-10-CM | POA: Diagnosis not present

## 2015-02-12 DIAGNOSIS — S80219A Abrasion, unspecified knee, initial encounter: Secondary | ICD-10-CM

## 2015-02-12 DIAGNOSIS — S4991XA Unspecified injury of right shoulder and upper arm, initial encounter: Secondary | ICD-10-CM | POA: Diagnosis not present

## 2015-02-12 DIAGNOSIS — Y9289 Other specified places as the place of occurrence of the external cause: Secondary | ICD-10-CM | POA: Insufficient documentation

## 2015-02-12 DIAGNOSIS — M25562 Pain in left knee: Secondary | ICD-10-CM

## 2015-02-12 DIAGNOSIS — S46811A Strain of other muscles, fascia and tendons at shoulder and upper arm level, right arm, initial encounter: Secondary | ICD-10-CM

## 2015-02-12 DIAGNOSIS — I10 Essential (primary) hypertension: Secondary | ICD-10-CM | POA: Diagnosis not present

## 2015-02-12 DIAGNOSIS — M25561 Pain in right knee: Secondary | ICD-10-CM

## 2015-02-12 DIAGNOSIS — M25571 Pain in right ankle and joints of right foot: Secondary | ICD-10-CM

## 2015-02-12 DIAGNOSIS — Z792 Long term (current) use of antibiotics: Secondary | ICD-10-CM | POA: Insufficient documentation

## 2015-02-12 DIAGNOSIS — M1990 Unspecified osteoarthritis, unspecified site: Secondary | ICD-10-CM | POA: Insufficient documentation

## 2015-02-12 DIAGNOSIS — Z79899 Other long term (current) drug therapy: Secondary | ICD-10-CM | POA: Insufficient documentation

## 2015-02-12 DIAGNOSIS — S80212A Abrasion, left knee, initial encounter: Secondary | ICD-10-CM | POA: Insufficient documentation

## 2015-02-12 DIAGNOSIS — S93401A Sprain of unspecified ligament of right ankle, initial encounter: Secondary | ICD-10-CM

## 2015-02-12 DIAGNOSIS — Y998 Other external cause status: Secondary | ICD-10-CM | POA: Insufficient documentation

## 2015-02-12 DIAGNOSIS — Y9389 Activity, other specified: Secondary | ICD-10-CM | POA: Insufficient documentation

## 2015-02-12 DIAGNOSIS — Z7951 Long term (current) use of inhaled steroids: Secondary | ICD-10-CM | POA: Diagnosis not present

## 2015-02-12 DIAGNOSIS — S8000XA Contusion of unspecified knee, initial encounter: Secondary | ICD-10-CM

## 2015-02-12 DIAGNOSIS — S99911A Unspecified injury of right ankle, initial encounter: Secondary | ICD-10-CM | POA: Insufficient documentation

## 2015-02-12 DIAGNOSIS — M62838 Other muscle spasm: Secondary | ICD-10-CM

## 2015-02-12 DIAGNOSIS — S8991XA Unspecified injury of right lower leg, initial encounter: Secondary | ICD-10-CM | POA: Diagnosis present

## 2015-02-12 MED ORDER — HYDROCODONE-ACETAMINOPHEN 5-325 MG PO TABS
1.0000 | ORAL_TABLET | Freq: Once | ORAL | Status: AC
  Administered 2015-02-12: 1 via ORAL
  Filled 2015-02-12: qty 1

## 2015-02-12 MED ORDER — TETANUS-DIPHTH-ACELL PERTUSSIS 5-2.5-18.5 LF-MCG/0.5 IM SUSP
0.5000 mL | Freq: Once | INTRAMUSCULAR | Status: AC
  Administered 2015-02-12: 0.5 mL via INTRAMUSCULAR
  Filled 2015-02-12: qty 0.5

## 2015-02-12 MED ORDER — BACITRACIN ZINC 500 UNIT/GM EX OINT
TOPICAL_OINTMENT | CUTANEOUS | Status: AC
Start: 2015-02-12 — End: 2015-02-12
  Administered 2015-02-12: 23:00:00
  Filled 2015-02-12: qty 0.9
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
## 2015-02-13 MED ORDER — CYCLOBENZAPRINE HCL 10 MG PO TABS: 10.0000 mg | ORAL_TABLET | Freq: Three times a day (TID) | ORAL | Status: DC | PRN

## 2015-02-13 MED ORDER — NAPROXEN 500 MG PO TABS: 500.0000 mg | ORAL_TABLET | Freq: Two times a day (BID) | ORAL | Status: DC | PRN

## 2015-02-13 MED ORDER — HYDROCODONE-ACETAMINOPHEN 5-325 MG PO TABS: 1.0000 | ORAL_TABLET | Freq: Four times a day (QID) | ORAL | Status: DC | PRN

## 2015-02-13 NOTE — Discharge Instructions (Signed)
Wear ankle brace for at least 2 weeks for stabilization of ankle. Use knee sleeves as needed for pain. Use crutches as needed for comfort. Ice and elevate ankle/knees throughout the day, and use heat to the areas of soreness in your shoulder. Alternate between naprosyn and norco for pain relief, with flexeril for muscle relaxation. Do not drive or operate machinery with pain medication or muscle relaxant use. Call orthopedic follow up today or tomorrow to schedule followup appointment for recheck of ongoing ankle/knee pain in 1-2 weeks that can be canceled with a 24-48 hour notice if complete resolution of pain. Return to the ER for changes or worsening symptoms.    Ankle Sprain An ankle sprain is an injury to the strong, fibrous tissues (ligaments) that hold your ankle bones together.  HOME CARE   Put ice on your ankle for 1-2 days or as told by your doctor.  Put ice in a plastic bag.  Place a towel between your skin and the bag.  Leave the ice on for 15-20 minutes at a time, every 2 hours while you are awake.  Only take medicine as told by your doctor.  Raise (elevate) your injured ankle above the level of your heart as much as possible for 2-3 days.  Use crutches if your doctor tells you to. Slowly put your own weight on the affected ankle. Use the crutches until you can walk without pain.  If you have a plaster splint:  Do not rest it on anything harder than a pillow for 24 hours.  Do not put weight on it.  Do not get it wet.  Take it off to shower or bathe.  If given, use an elastic wrap or support stocking for support. Take the wrap off if your toes lose feeling (numb), tingle, or turn cold or blue.  If you have an air splint:  Add or let out air to make it comfortable.  Take it off at night and to shower and bathe.  Wiggle your toes and move your ankle up and down often while you are wearing it. GET HELP IF:  You have rapidly increasing bruising or puffiness  (swelling).  Your toes feel very cold.  You lose feeling in your foot.  Your medicine does not help your pain. GET HELP RIGHT AWAY IF:   Your toes lose feeling (numb) or turn blue.  You have severe pain that is increasing. MAKE SURE YOU:   Understand these instructions.  Will watch your condition.  Will get help right away if you are not doing well or get worse. Document Released: 11/20/2007 Document Revised: 10/18/2013 Document Reviewed: 12/16/2011 [facility] Patient Information 2015 [place], [place]. This information is not intended to replace advice given to you by your health care provider. Make sure you discuss any questions you have with your health care provider.  Contusion A contusion is a deep bruise. Contusions happen when an injury causes bleeding under the skin. Signs of bruising include pain, puffiness (swelling), and discolored skin. The contusion may turn blue, purple, or yellow. HOME CARE   Put ice on the injured area.  Put ice in a plastic bag.  Place a towel between your skin and the bag.  Leave the ice on for 15-20 minutes, 03-04 times a day.  Only take medicine as told by your doctor.  Rest the injured area.  If possible, raise (elevate) the injured area to lessen puffiness. GET HELP RIGHT AWAY IF:   You have more bruising or  puffiness.  You have pain that is getting worse.  Your puffiness or pain is not helped by medicine. MAKE SURE YOU:   Understand these instructions.  Will watch your condition.  Will get help right away if you are not doing well or get worse. Document Released: 11/20/2007 Document Revised: 08/26/2011 Document Reviewed: 04/08/2011 [facility] Patient Information 2015 [place], [place]. This information is not intended to replace advice given to you by your health care provider. Make sure you discuss any questions you have with your health care provider.  Cryotherapy Cryotherapy means treatment with cold. Ice or gel packs can be  used to reduce both pain and swelling. Ice is the most helpful within the first 24 to 48 hours after an injury or flare-up from overusing a muscle or joint. Sprains, strains, spasms, burning pain, shooting pain, and aches can all be eased with ice. Ice can also be used when recovering from surgery. Ice is effective, has very few side effects, and is safe for most people to use. PRECAUTIONS  Ice is not a safe treatment option for people with:  Raynaud phenomenon. This is a condition affecting small blood vessels in the extremities. Exposure to cold may cause your problems to return.  Cold hypersensitivity. There are many forms of cold hypersensitivity, including:  Cold urticaria. Red, itchy hives appear on the skin when the tissues begin to warm after being iced.  Cold erythema. This is a red, itchy rash caused by exposure to cold.  Cold hemoglobinuria. Red blood cells break down when the tissues begin to warm after being iced. The hemoglobin that carry oxygen are passed into the urine because they cannot combine with blood proteins fast enough.  Numbness or altered sensitivity in the area being iced. If you have any of the following conditions, do not use ice until you have discussed cryotherapy with your caregiver:  Heart conditions, such as arrhythmia, angina, or chronic heart disease.  High blood pressure.  Healing wounds or open skin in the area being iced.  Current infections.  Rheumatoid arthritis.  Poor circulation.  Diabetes. Ice slows the blood flow in the region it is applied. This is beneficial when trying to stop inflamed tissues from spreading irritating chemicals to surrounding tissues. However, if you expose your skin to cold temperatures for too long or without the proper protection, you can damage your skin or nerves. Watch for signs of skin damage due to cold. HOME CARE INSTRUCTIONS Follow these tips to use ice and cold packs safely.  Place a dry or damp towel  between the ice and skin. A damp towel will cool the skin more quickly, so you may need to shorten the time that the ice is used.  For a more rapid response, add gentle compression to the ice.  Ice for no more than 10 to 20 minutes at a time. The bonier the area you are icing, the less time it will take to get the benefits of ice.  Check your skin after 5 minutes to make sure there are no signs of a poor response to cold or skin damage.  Rest 20 minutes or more between uses.  Once your skin is numb, you can end your treatment. You can test numbness by very lightly touching your skin. The touch should be so light that you do not see the skin dimple from the pressure of your fingertip. When using ice, most people will feel these normal sensations in this order: cold, burning, aching, and numbness.  Do not use ice on someone who cannot communicate their responses to pain, such as small children or people with dementia. HOW TO MAKE AN ICE PACK Ice packs are the most common way to use ice therapy. Other methods include ice massage, ice baths, and cryosprays. Muscle creams that cause a cold, tingly feeling do not offer the same benefits that ice offers and should not be used as a substitute unless recommended by your caregiver. To make an ice pack, do one of the following:  Place crushed ice or a bag of frozen vegetables in a sealable plastic bag. Squeeze out the excess air. Place this bag inside another plastic bag. Slide the bag into a pillowcase or place a damp towel between your skin and the bag.  Mix 3 parts water with 1 part rubbing alcohol. Freeze the mixture in a sealable plastic bag. When you remove the mixture from the freezer, it will be slushy. Squeeze out the excess air. Place this bag inside another plastic bag. Slide the bag into a pillowcase or place a damp towel between your skin and the bag. SEEK MEDICAL CARE IF:  You develop white spots on your skin. This may give the skin a  blotchy (mottled) appearance.  Your skin turns blue or pale.  Your skin becomes waxy or hard.  Your swelling gets worse. MAKE SURE YOU:   Understand these instructions.  Will watch your condition.  Will get help right away if you are not doing well or get worse. Document Released: 01/28/2011 Document Revised: 10/18/2013 Document Reviewed: 01/28/2011 [facility] Patient Information 2015 [place], [place]. This information is not intended to replace advice given to you by your health care provider. Make sure you discuss any questions you have with your health care provider.  Knee Pain The knee is the complex joint between your thigh and your lower leg. It is made up of bones, tendons, ligaments, and cartilage. The bones that make up the knee are:  The femur in the thigh.  The tibia and fibula in the lower leg.  The patella or kneecap riding in the groove on the lower femur. CAUSES  Knee pain is a common complaint with many causes. A few of these causes are:  Injury, such as:  A ruptured ligament or tendon injury.  Torn cartilage.  Medical conditions, such as:  Gout  Arthritis  Infections  Overuse, over training, or overdoing a physical activity. Knee pain can be minor or severe. Knee pain can accompany debilitating injury. Minor knee problems often respond well to self-care measures or get well on their own. More serious injuries may need medical intervention or even surgery. SYMPTOMS The knee is complex. Symptoms of knee problems can vary widely. Some of the problems are:  Pain with movement and weight bearing.  Swelling and tenderness.  Buckling of the knee.  Inability to straighten or extend your knee.  Your knee locks and you cannot straighten it.  Warmth and redness with pain and fever.  Deformity or dislocation of the kneecap. DIAGNOSIS  Determining what is wrong may be very straight forward such as when there is an injury. It can also be challenging  because of the complexity of the knee. Tests to make a diagnosis may include:  Your caregiver taking a history and doing a physical exam.  Routine X-rays can be used to rule out other problems. X-rays will not reveal a cartilage tear. Some injuries of the knee can be diagnosed by:  Arthroscopy a surgical technique  by which a small video camera is inserted through tiny incisions on the sides of the knee. This procedure is used to examine and repair internal knee joint problems. Tiny instruments can be used during arthroscopy to repair the torn knee cartilage (meniscus).  Arthrography is a radiology technique. A contrast liquid is directly injected into the knee joint. Internal structures of the knee joint then become visible on X-ray film.  An MRI scan is a non X-ray radiology procedure in which magnetic fields and a computer produce two- or three-dimensional images of the inside of the knee. Cartilage tears are often visible using an MRI scanner. MRI scans have largely replaced arthrography in diagnosing cartilage tears of the knee.  Blood work.  Examination of the fluid that helps to lubricate the knee joint (synovial fluid). This is done by taking a sample out using a needle and a syringe. TREATMENT The treatment of knee problems depends on the cause. Some of these treatments are:  Depending on the injury, proper casting, splinting, surgery, or physical therapy care will be needed.  Give yourself adequate recovery time. Do not overuse your joints. If you begin to get sore during workout routines, back off. Slow down or do fewer repetitions.  For repetitive activities such as cycling or running, maintain your strength and nutrition.  Alternate muscle groups. For example, if you are a weight lifter, work the upper body on one day and the lower body the next.  Either tight or weak muscles do not give the proper support for your knee. Tight or weak muscles do not absorb the stress placed on  the knee joint. Keep the muscles surrounding the knee strong.  Take care of mechanical problems.  If you have flat feet, orthotics or special shoes may help. See your caregiver if you need help.  Arch supports, sometimes with wedges on the inner or outer aspect of the heel, can help. These can shift pressure away from the side of the knee most bothered by osteoarthritis.  A brace called an "unloader" brace also may be used to help ease the pressure on the most arthritic side of the knee.  If your caregiver has prescribed crutches, braces, wraps or ice, use as directed. The acronym for this is PRICE. This means protection, rest, ice, compression, and elevation.  Nonsteroidal anti-inflammatory drugs (NSAIDs), can help relieve pain. But if taken immediately after an injury, they may actually increase swelling. Take NSAIDs with food in your stomach. Stop them if you develop stomach problems. Do not take these if you have a history of ulcers, stomach pain, or bleeding from the bowel. Do not take without your caregiver's approval if you have problems with fluid retention, heart failure, or kidney problems.  For ongoing knee problems, physical therapy may be helpful.  Glucosamine and chondroitin are over-the-counter dietary supplements. Both may help relieve the pain of osteoarthritis in the knee. These medicines are different from the usual anti-inflammatory drugs. Glucosamine may decrease the rate of cartilage destruction.  Injections of a corticosteroid drug into your knee joint may help reduce the symptoms of an arthritis flare-up. They may provide pain relief that lasts a few months. You may have to wait a few months between injections. The injections do have a small increased risk of infection, water retention, and elevated blood sugar levels.  Hyaluronic acid injected into damaged joints may ease pain and provide lubrication. These injections may work by reducing inflammation. A series of shots  may give relief for as  long as 6 months.  Topical painkillers. Applying certain ointments to your skin may help relieve the pain and stiffness of osteoarthritis. Ask your pharmacist for suggestions. Many over the-counter products are approved for temporary relief of arthritis pain.  In some countries, doctors often prescribe topical NSAIDs for relief of chronic conditions such as arthritis and tendinitis. A review of treatment with NSAID creams found that they worked as well as oral medications but without the serious side effects. PREVENTION  Maintain a healthy weight. Extra pounds put more strain on your joints.  Get strong, stay limber. Weak muscles are a common cause of knee injuries. Stretching is important. Include flexibility exercises in your workouts.  Be smart about exercise. If you have osteoarthritis, chronic knee pain or recurring injuries, you may need to change the way you exercise. This does not mean you have to stop being active. If your knees ache after jogging or playing basketball, consider switching to swimming, water aerobics, or other low-impact activities, at least for a few days a week. Sometimes limiting high-impact activities will provide relief.  Make sure your shoes fit well. Choose footwear that is right for your sport.  Protect your knees. Use the proper gear for knee-sensitive activities. Use kneepads when playing volleyball or laying carpet. Buckle your seat belt every time you drive. Most shattered kneecaps occur in car accidents.  Rest when you are tired. SEEK MEDICAL CARE IF:  You have knee pain that is continual and does not seem to be getting better.  SEEK IMMEDIATE MEDICAL CARE IF:  Your knee joint feels hot to the touch and you have a high fever. MAKE SURE YOU:   Understand these instructions.  Will watch your condition.  Will get help right away if you are not doing well or get worse. Document Released: 03/31/2007 Document Revised: 08/26/2011  Document Reviewed: 03/31/2007 [facility] Patient Information 2015 [place], [place]. This information is not intended to replace advice given to you by your health care provider. Make sure you discuss any questions you have with your health care provider.  Muscle Strain A muscle strain is an injury that occurs when a muscle is stretched beyond its normal length. Usually a small number of muscle fibers are torn when this happens. Muscle strain is rated in degrees. First-degree strains have the least amount of muscle fiber tearing and pain. Second-degree and third-degree strains have increasingly more tearing and pain.  Usually, recovery from muscle strain takes 1-2 weeks. Complete healing takes 5-6 weeks.  CAUSES  Muscle strain happens when a sudden, violent force placed on a muscle stretches it too far. This may occur with lifting, sports, or a fall.  RISK FACTORS Muscle strain is especially common in athletes.  SIGNS AND SYMPTOMS At the site of the muscle strain, there may be:  Pain.  Bruising.  Swelling.  Difficulty using the muscle due to pain or lack of normal function. DIAGNOSIS  Your health care provider will perform a physical exam and ask about your medical history. TREATMENT  Often, the best treatment for a muscle strain is resting, icing, and applying cold compresses to the injured area.  HOME CARE INSTRUCTIONS   Use the PRICE method of treatment to promote muscle healing during the first 2-3 days after your injury. The PRICE method involves:  Protecting the muscle from being injured again.  Restricting your activity and resting the injured body part.  Icing your injury. To do this, put ice in a plastic bag. Place a towel between  your skin and the bag. Then, apply the ice and leave it on from 15-20 minutes each hour. After the third day, switch to moist heat packs.  Apply compression to the injured area with a splint or elastic bandage. Be careful not to wrap it too tightly.  This may interfere with blood circulation or increase swelling.  Elevate the injured body part above the level of your heart as often as you can.  Only take over-the-counter or prescription medicines for pain, discomfort, or fever as directed by your health care provider.  Warming up prior to exercise helps to prevent future muscle strains. SEEK MEDICAL CARE IF:   You have increasing pain or swelling in the injured area.  You have numbness, tingling, or a significant loss of strength in the injured area. MAKE SURE YOU:   Understand these instructions.  Will watch your condition.  Will get help right away if you are not doing well or get worse. Document Released: 06/03/2005 Document Revised: 03/24/2013 Document Reviewed: 12/31/2012 [facility] Patient Information 2015 [place], [place]. This information is not intended to replace advice given to you by your health care provider. Make sure you discuss any questions you have with your health care provider.  Muscle Cramps and Spasms Muscle cramps and spasms are when muscles tighten by themselves. They usually get better within minutes. Muscle cramps are painful. They are usually stronger and last longer than muscle spasms. Muscle spasms may or may not be painful. They can last a few seconds or much longer. HOME CARE  Drink enough fluid to keep your pee (urine) clear or pale yellow.  Massage, stretch, and relax the muscle.  Use a warm towel, heating pad, or warm shower water on tight muscles.  Place ice on the muscle if it is tender or in pain.  Put ice in a plastic bag.  Place a towel between your skin and the bag.  Leave the ice on for 15-20 minutes, 03-04 times a day.  Only take medicine as told by your doctor. GET HELP RIGHT AWAY IF:  Your cramps or spasms get worse, happen more often, or do not get better with time. MAKE SURE YOU:  Understand these instructions.  Will watch your condition.  Will get help right away if you  are not doing well or get worse. Document Released: 05/16/2008 Document Revised: 09/28/2012 Document Reviewed: 05/20/2012 [facility] Patient Information 2015 [place], [place]. This information is not intended to replace advice given to you by your health care provider. Make sure you discuss any questions you have with your health care provider.  Heat Therapy Heat therapy can help make painful, stiff muscles and joints feel better. Do not use heat on new injuries. Wait at least 48 hours after an injury to use heat. Do not use heat when you have aches or pains right after an activity. If you still have pain 3 hours after stopping the activity, then you may use heat. HOME CARE Wet heat pack  Soak a clean towel in warm water. Squeeze out the extra water.  Put the warm, wet towel in a plastic bag.  Place a thin, dry towel between your skin and the bag.  Put the heat pack on the area for 5 minutes, and check your skin. Your skin may be pink, but it should not be red.  Leave the heat pack on the area for 15 to 30 minutes.  Repeat this every 2 to 4 hours while awake. Do not use heat while you  are sleeping. Warm water bath  Fill a tub with warm water.  Place the affected body part in the tub.  Soak the area for 20 to 40 minutes.  Repeat as needed. Hot water bottle  Fill the water bottle half full with hot water.  Press out the extra air. Close the cap tightly.  Place a dry towel between your skin and the bottle.  Put the bottle on the area for 5 minutes, and check your skin. Your skin may be pink, but it should not be red.  Leave the bottle on the area for 15 to 30 minutes.  Repeat this every 2 to 4 hours while awake. Electric heating pad  Place a dry towel between your skin and the heating pad.  Set the heating pad on low heat.  Put the heating pad on the area for 10 minutes, and check your skin. Your skin may be pink, but it should not be red.  Leave the heating pad on the  area for 20 to 40 minutes.  Repeat this every 2 to 4 hours while awake.  Do not lie on the heating pad.  Do not fall asleep while using the heating pad.  Do not use the heating pad near water. GET HELP RIGHT AWAY IF:  You get blisters or red skin.  Your skin is puffy (swollen), or you lose feeling (numbness) in the affected area.  You have any new problems.  Your problems are getting worse.  You have any questions or concerns. If you have any problems, stop using heat therapy until you see your doctor. MAKE SURE YOU:  Understand these instructions.  Will watch your condition.  Will get help right away if you are not doing well or get worse. Document Released: 08/26/2011 Document Reviewed: 07/27/2013 [facility] Patient Information 2015 [place], [place]. This information is not intended to replace advice given to you by your health care provider. Make sure you discuss any questions you have with your health care provider.  Abrasions An abrasion is a cut or scrape of the skin. Abrasions do not go through all layers of the skin. HOME CARE  If a bandage (dressing) was put on your wound, change it as told by your doctor. If the bandage sticks, soak it off with warm.  Wash the area with water and soap 2 times a day. Rinse off the soap. Pat the area dry with a clean towel.  Put on medicated cream (ointment) as told by your doctor.  Change your bandage right away if it gets wet or dirty.  Only take medicine as told by your doctor.  See your doctor within 24-48 hours to get your wound checked.  Check your wound for redness, puffiness (swelling), or yellowish-white fluid (pus). GET HELP RIGHT AWAY IF:   You have more pain in the wound.  You have redness, swelling, or tenderness around the wound.  You have pus coming from the wound.  You have a fever or lasting symptoms for more than 2-3 days.  You have a fever and your symptoms suddenly get worse.  You have a bad smell  coming from the wound or bandage. MAKE SURE YOU:   Understand these instructions.  Will watch your condition.  Will get help right away if you are not doing well or get worse. Document Released: 11/20/2007 Document Revised: 02/26/2012 Document Reviewed: 05/07/2011 [facility] Patient Information 2015 [place], [place]. This information is not intended to replace advice given to you by your health care  provider. Make sure you discuss any questions you have with your health care provider. ° °

## 2015-02-21 ENCOUNTER — Telehealth: Payer: Self-pay | Admitting: Family Medicine

## 2015-02-21 DIAGNOSIS — M159 Polyosteoarthritis, unspecified: Secondary | ICD-10-CM

## 2015-02-21 DIAGNOSIS — M150 Primary generalized (osteo)arthritis: Principal | ICD-10-CM

## 2015-02-21 DIAGNOSIS — J309 Allergic rhinitis, unspecified: Secondary | ICD-10-CM

## 2015-02-21 DIAGNOSIS — I10 Essential (primary) hypertension: Secondary | ICD-10-CM

## 2015-02-21 NOTE — Telephone Encounter (Signed)
Need refills sent to Mail order pharmacy for naproxen,singular,losartan-hctz and her fluticasone nasal spray.  Please order all meds for 90 day supply.

## 2015-02-22 MED ORDER — NAPROXEN 500 MG PO TABS: 500.0000 mg | ORAL_TABLET | Freq: Two times a day (BID) | ORAL | Status: DC | PRN

## 2015-02-22 MED ORDER — MONTELUKAST SODIUM 10 MG PO TABS: 10.0000 mg | ORAL_TABLET | Freq: Every day | ORAL | Status: DC

## 2015-02-22 MED ORDER — LOSARTAN POTASSIUM-HCTZ 100-12.5 MG PO TABS: 1.0000 | ORAL_TABLET | Freq: Every day | ORAL | Status: DC

## 2015-02-22 MED ORDER — FLUTICASONE PROPIONATE 50 MCG/ACT NA SUSP: 2.0000 | Freq: Every day | NASAL | Status: DC

## 2015-02-22 NOTE — Telephone Encounter (Signed)
Prescriptions sent to pharmacy

## 2015-06-27 ENCOUNTER — Other Ambulatory Visit: Payer: Self-pay | Admitting: *Deleted

## 2015-06-27 DIAGNOSIS — I10 Essential (primary) hypertension: Secondary | ICD-10-CM

## 2015-06-28 MED ORDER — LOSARTAN POTASSIUM-HCTZ 100-12.5 MG PO TABS: 1.0000 | ORAL_TABLET | Freq: Every day | ORAL | Status: DC

## 2015-08-04 ENCOUNTER — Encounter: Payer: Self-pay | Admitting: Family Medicine

## 2015-08-04 NOTE — Progress Notes (Signed)
PatientIVONE LICHT Avilla, female   DOB: [birth year], 58 y.o.   MRN: [id number]  Mrs. Bugh recently presented with her grandson, Madie Cahn, for his office visit on 08/02/15. Please see below concerning her behavior during encounter (taken from Tyler's office note):  "Note that when first beginning conversation that request for letter will have to be discussed with attending concerning legality and policy, Tyler's grandmother Jirah Rider initially agreed with plan. Mrs. Chim then suddenly became enraged when discussing another topic and stood up walked toward me yelling. Mrs. Alton was asked to take a seat and encouraged to calmly discuss her concerns but did not comply. She then came closer to me while still yelling and when she was approximately 1-2 feet away I asked for her to step away or I would have to leave the room. Again, she did not comply and I left the room to discuss with Dr. Jennette Kettle and Clint Guy. Per nursing, after I left the room Mrs. David ran out of the room to the nurses station and began yelling at them, blocking nurse Shari into the nurses station and preventing her from leaving. Per reports, she was yelling "That doctor is treating me differently because I am black. I want a doctor that is not afraid of black people." Mrs. Mccarey was encouraged to return to her room. After discussion with Clint Guy and Dr. Jennette Kettle, the three of [place] returned to the room. Discussion took place that letter stating what she claimed could be written however it would state that she and not the medical facility was stating that was the reason for the absences. The form requesting home health services will be completed. Mrs. Depaul then stated that she was not angry and denied yelling at me and office staff, stating she was everyone's friend. Office visit ultimately ended on a positive note."

## 2015-08-07 ENCOUNTER — Ambulatory Visit: Admitting: Family Medicine

## 2015-08-09 ENCOUNTER — Ambulatory Visit (INDEPENDENT_AMBULATORY_CARE_PROVIDER_SITE_OTHER): Admitting: Family Medicine

## 2015-08-09 ENCOUNTER — Encounter: Payer: Self-pay | Admitting: Family Medicine

## 2015-08-09 VITALS — BP 131/54 | HR 74 | Temp 97.7°F

## 2015-08-09 DIAGNOSIS — B9789 Other viral agents as the cause of diseases classified elsewhere: Secondary | ICD-10-CM

## 2015-08-09 DIAGNOSIS — A084 Viral intestinal infection, unspecified: Secondary | ICD-10-CM | POA: Diagnosis not present

## 2015-08-09 DIAGNOSIS — J069 Acute upper respiratory infection, unspecified: Secondary | ICD-10-CM

## 2015-08-09 MED ORDER — ONDANSETRON 4 MG PO TBDP
4.0000 mg | ORAL_TABLET | Freq: Three times a day (TID) | ORAL | Status: DC | PRN
Start: 2015-08-09 — End: 2015-08-14

## 2015-08-09 MED ORDER — ONDANSETRON 4 MG PO TBDP: 4.0000 mg | ORAL_TABLET | Freq: Once | ORAL | Status: DC

## 2015-08-09 NOTE — Patient Instructions (Signed)
You may use Motrin or Children's Tylenol as needed for fever/pain.  You can also use honey for cough or sore throat.  Make sure that you are is drinking plenty of fluids. I have sent in a prescription for Zofran (nausea medication).  Use this sparingly as needed for vomiting.  If symptoms do not improve in the next week, please schedule an appointment to be seen.  Viral Gastroenteritis Viral gastroenteritis is also called stomach flu. This illness is caused by a certain type of germ (virus). It can cause sudden watery poop (diarrhea) and throwing up (vomiting). This can cause you to lose body fluids (dehydration). This illness usually lasts for 3 to 8 days. It usually goes away on its own. HOME CARE   Drink enough fluids to keep your pee (urine) clear or pale yellow. Drink small amounts of fluids often.  Ask your doctor how to replace body fluid losses (rehydration).  Avoid:  Foods high in sugar.  Alcohol.  Bubbly (carbonated) drinks.  Tobacco.  Juice.  Caffeine drinks.  Very hot or cold fluids.  Fatty, greasy foods.  Eating too much at one time.  Dairy products until 24 to 48 hours after your watery poop stops.  You may eat foods with active cultures (probiotics). They can be found in some yogurts and supplements.  Wash your hands well to avoid spreading the illness.  Only take medicines as told by your doctor. Do not give aspirin to children. Do not take medicines for watery poop (antidiarrheals).  Ask your doctor if you should keep taking your regular medicines.  Keep all doctor visits as told. GET HELP RIGHT AWAY IF:   You cannot keep fluids down.  You do not pee at least once every 6 to 8 hours.  You are short of breath.  You see blood in your poop or throw up. This may look like coffee grounds.  You have belly (abdominal) pain that gets worse or is just in one small spot (localized).  You keep throwing up or having watery poop.  You have a fever.  The  patient is a child younger than 3 months, and he or she has a fever.  The patient is a child older than 3 months, and he or she has a fever and problems that do not go away.  The patient is a child older than 3 months, and he or she has a fever and problems that suddenly get worse.  The patient is a baby, and he or she has no tears when crying. MAKE SURE YOU:   Understand these instructions.  Will watch your condition.  Will get help right away if you are not doing well or get worse.   This information is not intended to replace advice given to you by your health care provider. Make sure you discuss any questions you have with your health care provider.   Document Released: 11/20/2007 Document Revised: 08/26/2011 Document Reviewed: 03/20/2011 Elsevier Interactive Patient Education 2016 Elsevier Inc. Upper Respiratory Infection, Adult Most upper respiratory infections (URIs) are caused by a virus. A URI affects the nose, throat, and upper air passages. The most common type of URI is often called "the common cold." HOME CARE   Take medicines only as told by your doctor.  Gargle warm saltwater or take cough drops to comfort your throat as told by your doctor.  Use a warm mist humidifier or inhale steam from a shower to increase air moisture. This may make it easier to  breathe.  Drink enough fluid to keep your pee (urine) clear or pale yellow.  Eat soups and other clear broths.  Have a healthy diet.  Rest as needed.  Go back to work when your fever is gone or your doctor says it is okay.  You may need to stay home longer to avoid giving your URI to others.  You can also wear a face mask and wash your hands often to prevent spread of the virus.  Use your inhaler more if you have asthma.  Do not use any tobacco products, including cigarettes, chewing tobacco, or electronic cigarettes. If you need help quitting, ask your doctor. GET HELP IF:  You are getting worse, not  better.  Your symptoms are not helped by medicine.  You have chills.  You are getting more short of breath.  You have brown or red mucus.  You have yellow or brown discharge from your nose.  You have pain in your face, especially when you bend forward.  You have a fever.  You have puffy (swollen) neck glands.  You have pain while swallowing.  You have white areas in the back of your throat. GET HELP RIGHT AWAY IF:   You have very bad or constant:  Headache.  Ear pain.  Pain in your forehead, behind your eyes, and over your cheekbones (sinus pain).  Chest pain.  You have long-lasting (chronic) lung disease and any of the following:  Wheezing.  Long-lasting cough.  Coughing up blood.  A change in your usual mucus.  You have a stiff neck.  You have changes in your:  Vision.  Hearing.  Thinking.  Mood. MAKE SURE YOU:   Understand these instructions.  Will watch your condition.  Will get help right away if you are not doing well or get worse.   This information is not intended to replace advice given to you by your health care provider. Make sure you discuss any questions you have with your health care provider.   Document Released: 11/20/2007 Document Revised: 10/18/2014 Document Reviewed: 09/08/2013 Elsevier Interactive Patient Education [employer]

## 2015-08-09 NOTE — Progress Notes (Signed)
   Subjective: CC: URI/GI viral symptoms [name] is a 60 y.o. female presenting to clinic today for same day appointment. PCP: Melissa Heater, DO Concerns today include:  1. Viral symptoms Patient notes that she became sick with diarrhea, cough, crampy stomach ache and nausea/vomiting on 1/27.  She notes that her grandchildren have been sick with similar things.  Diarrhea and vomiting is nonbloody.  Subjective fevers and chills.  Endorses muscle aches.  She did not have a flu shot.  Able to drink normally.   Has been taking BC powder which helped with headache.  Last episode of diarrhea and vomiting was this am before the appt.  Social History Reviewed. FamHx and MedHx reviewed.  Please see EMR.  ROS: Per HPI  Objective: Office vital signs reviewed. BP 131/54 mmHg  Pulse 74  Temp(Src) 97.7 F (36.5 C) (Oral)  Physical Examination:  General: Awake, alert, well nourished, No acute distress HEENT: Normal    Neck: No masses palpated. No lymphadenopathy    Ears: Tympanic membranes intact, normal light reflex, no erythema, no bulging    Eyes: PERRLA, EOMI    Nose: nasal turbinates moist, boggy    Throat: moist mucus membranes, mild o/p erythema Cardio: regular rate and rhythm, S1S2 heard, no murmurs appreciated Pulm: clear to auscultation bilaterally, no wheezes, rhonchi or rales, normal work of breathing on room air GI: soft, non-tender, non-distended, bowel sounds present x4, no masses  Assessment/ Plan: 60 y.o. female   1. Viral gastroenteritis.  No evidence of acute abdomen.  Patient is afebrile during visit.  Responded well to Zofran given in office. - ondansetron (ZOFRAN-ODT) disintegrating tablet 4 mg; Take 1 tablet (4 mg total) by mouth once. - ondansetron (ZOFRAN ODT) 4 MG disintegrating tablet; Take 1 tablet (4 mg total) by mouth every 8 (eight) hours as needed for nausea or vomiting.  Dispense: 20 tablet; Refill: 0  2. Viral URI with cough. No evidence of  bacterial infection on exam. - Supportive care discussed - Hydration encouraged - Continue Motrin/ Tylenol as needed for headache or body aches - Return precautions reviewed - Follow up with PCP if no improvement in 1 week.  Melissa Ip, DO PGY-2, [facility]

## 2015-08-14 ENCOUNTER — Emergency Department (HOSPITAL_COMMUNITY)

## 2015-08-14 ENCOUNTER — Encounter (HOSPITAL_COMMUNITY): Payer: Self-pay | Admitting: *Deleted

## 2015-08-14 ENCOUNTER — Emergency Department (HOSPITAL_COMMUNITY)
Admission: EM | Admit: 2015-08-14 | Discharge: 2015-08-14 | Disposition: A | Discharge status: Home / Self Care | Attending: Emergency Medicine | Admitting: Emergency Medicine

## 2015-08-14 DIAGNOSIS — R112 Nausea with vomiting, unspecified: Secondary | ICD-10-CM | POA: Diagnosis not present

## 2015-08-14 DIAGNOSIS — E876 Hypokalemia: Secondary | ICD-10-CM | POA: Diagnosis not present

## 2015-08-14 DIAGNOSIS — Z87891 Personal history of nicotine dependence: Secondary | ICD-10-CM | POA: Diagnosis not present

## 2015-08-14 DIAGNOSIS — Z7951 Long term (current) use of inhaled steroids: Secondary | ICD-10-CM | POA: Insufficient documentation

## 2015-08-14 DIAGNOSIS — Z79899 Other long term (current) drug therapy: Secondary | ICD-10-CM | POA: Diagnosis not present

## 2015-08-14 DIAGNOSIS — I10 Essential (primary) hypertension: Secondary | ICD-10-CM | POA: Insufficient documentation

## 2015-08-14 DIAGNOSIS — A084 Viral intestinal infection, unspecified: Secondary | ICD-10-CM

## 2015-08-14 DIAGNOSIS — M1990 Unspecified osteoarthritis, unspecified site: Secondary | ICD-10-CM | POA: Insufficient documentation

## 2015-08-14 DIAGNOSIS — E860 Dehydration: Secondary | ICD-10-CM | POA: Insufficient documentation

## 2015-08-14 DIAGNOSIS — R0602 Shortness of breath: Secondary | ICD-10-CM | POA: Insufficient documentation

## 2015-08-14 DIAGNOSIS — E871 Hypo-osmolality and hyponatremia: Secondary | ICD-10-CM | POA: Insufficient documentation

## 2015-08-14 DIAGNOSIS — R079 Chest pain, unspecified: Secondary | ICD-10-CM | POA: Insufficient documentation

## 2015-08-14 DIAGNOSIS — R5383 Other fatigue: Secondary | ICD-10-CM | POA: Diagnosis present

## 2015-08-14 LAB — COMPREHENSIVE METABOLIC PANEL
ALBUMIN: 3.5 g/dL (ref 3.5–5.0)
ALT: 20 U/L (ref 14–54)
AST: 26 U/L (ref 15–41)
Alkaline Phosphatase: 48 U/L (ref 38–126)
Anion gap: 16 — ABNORMAL HIGH (ref 5–15)
BILIRUBIN TOTAL: 1 mg/dL (ref 0.3–1.2)
BUN: 8 mg/dL (ref 6–20)
CHLORIDE: 90 mmol/L — AB (ref 101–111)
CO2: 19 mmol/L — AB (ref 22–32)
Calcium: 8.3 mg/dL — ABNORMAL LOW (ref 8.9–10.3)
Creatinine, Ser: 1.21 mg/dL — ABNORMAL HIGH (ref 0.44–1.00)
GFR calc Af Amer: 56 mL/min — ABNORMAL LOW (ref >60)
GFR calc non Af Amer: 48 mL/min — ABNORMAL LOW (ref >60)
GLUCOSE: 131 mg/dL — AB (ref 65–99)
POTASSIUM: 2.5 mmol/L — AB (ref 3.5–5.1)
SODIUM: 125 mmol/L — AB (ref 135–145)
Total Protein: 6.2 g/dL — ABNORMAL LOW (ref 6.5–8.1)

## 2015-08-14 LAB — CBC
HEMATOCRIT: 38.5 % (ref 36.0–46.0)
Hemoglobin: 13.5 g/dL (ref 12.0–15.0)
MCH: 29.8 pg (ref 26.0–34.0)
MCHC: 35.1 g/dL (ref 30.0–36.0)
MCV: 85 fL (ref 78.0–100.0)
PLATELETS: 257 10*3/uL (ref 150–400)
RBC: 4.53 MIL/uL (ref 3.87–5.11)
RDW: 11.9 % (ref 11.5–15.5)
WBC: 7.3 10*3/uL (ref 4.0–10.5)

## 2015-08-14 LAB — I-STAT TROPONIN, ED: Troponin i, poc: 0 ng/mL (ref 0.00–0.08)

## 2015-08-14 LAB — LIPASE, BLOOD: LIPASE: 30 U/L (ref 11–51)

## 2015-08-14 LAB — I-STAT CG4 LACTIC ACID, ED: Lactic Acid, Venous: 2.13 mmol/L (ref 0.5–2.0)

## 2015-08-14 MED ORDER — ONDANSETRON HCL 4 MG/2ML IJ SOLN
4.0000 mg | Freq: Once | INTRAMUSCULAR | Status: AC
  Administered 2015-08-14: 4 mg via INTRAVENOUS
  Filled 2015-08-14: qty 2

## 2015-08-14 MED ORDER — ONDANSETRON 4 MG PO TBDP
4.0000 mg | ORAL_TABLET | Freq: Once | ORAL | Status: AC
  Administered 2015-08-14: 4 mg via ORAL

## 2015-08-14 MED ORDER — POTASSIUM CHLORIDE CRYS ER 20 MEQ PO TBCR: 20.0000 meq | EXTENDED_RELEASE_TABLET | Freq: Every day | ORAL | Status: DC

## 2015-08-14 MED ORDER — ONDANSETRON 4 MG PO TBDP: 4.0000 mg | ORAL_TABLET | Freq: Three times a day (TID) | ORAL | Status: DC | PRN

## 2015-08-14 MED ORDER — SODIUM CHLORIDE 0.9 % IV BOLUS (SEPSIS)
1000.0000 mL | Freq: Once | INTRAVENOUS | Status: AC
  Administered 2015-08-14: 1000 mL via INTRAVENOUS

## 2015-08-14 MED ORDER — POTASSIUM CHLORIDE CRYS ER 20 MEQ PO TBCR
40.0000 meq | EXTENDED_RELEASE_TABLET | Freq: Once | ORAL | Status: AC
  Administered 2015-08-14: 40 meq via ORAL
  Filled 2015-08-14: qty 2

## 2015-08-14 MED ORDER — ONDANSETRON 4 MG PO TBDP
ORAL_TABLET | ORAL | Status: AC
  Filled 2015-08-14: qty 1

## 2015-08-14 NOTE — ED Notes (Signed)
Was seen at family practice on Friday and dx with flu now weak and sob and dizzy has had n/v/d

## 2015-08-14 NOTE — ED Notes (Signed)
To x-ray

## 2015-08-14 NOTE — ED Notes (Signed)
Pt unable to tolerate PO- pt vomited after potassium pill administered and meal given.

## 2015-08-14 NOTE — ED Notes (Signed)
Dr aware of low potassium 2,5

## 2015-08-14 NOTE — ED Notes (Signed)
Pt sts she is hungry. Okay to feed pt per MD.

## 2015-08-14 NOTE — ED Notes (Signed)
Per pt and family, pt had recent GI virus with n/v/d. Today had onset of shaking, tremors, chills and sob. ekg done at triage.

## 2015-08-14 NOTE — ED Provider Notes (Signed)
CSN: [id number]     Arrival date & time 08/14/15  1339 History   First MD Initiated Contact with Patient 08/14/15 1432     Chief Complaint  Patient presents with  . Shortness of Breath  . Fatigue   History provided by patient.  (Consider location/radiation/quality/duration/timing/severity/associated sxs/prior Treatment) HPI   Patient presents to ED currently with persistent flu-like and GI symptoms since last seen for same problems at PCP on 08/09/15. At that time she was diagnosed with viral gastroenteritis and viral URI with cough, treated with Zofran ODT supportive care, there were with multiple sick contacts at home with multiple children diagnosed with influenza, reportedly "confirmed by swab at pediatrician office", patient never received flu shot. Additionally another recent illness similar symptoms with vomiting / diarrhea back on 07/14/15 with intermittent episodes since then. Currently with persistent symptoms without significant improvement. Admits to reduced PO intake, only tolerating some small amounts of clear fluids, taking Tylenol at home without relief, never filled the Zofran ODT she was given, continues to endorse productive cough, nausea with vomiting (non-bloody and non bilious) and diarrhea, also Right sided chest wall pain worse with coughing.  Past Medical History  Diagnosis Date  . Hypertension   . Arthritis     back   Past Surgical History  Procedure Laterality Date  . Tubal ligation     Family History  Problem Relation Age of Onset  . Hypertension Mother   . Cancer Mother 14    lung cancer in former smoker  . Hypertension Father   . Heart disease Brother    Social History  Substance Use Topics  . Smoking status: Former Games developer  . Smokeless tobacco: Never Used     Comment: smoked years ago for 3-4 years  . Alcohol Use: Yes     Comment: drinks at parties but did have DWI after drinking at a party. Denies daily EtOH use   OB History    No data available      Review of Systems  Admits generalized myalgias including back pain, active nausea / vomiting in ED Denies fevers/chills recently, chest pain, shortness of breath, abdominal pain, dysuria, hematuria, edema, rash  Allergies  Review of patient's allergies indicates no known allergies.  Home Medications   Prior to Admission medications   Medication Sig Start Date End Date Taking? Authorizing Provider  cyclobenzaprine (FLEXERIL) 10 MG tablet Take 1 tablet (10 mg total) by mouth 3 (three) times daily as needed for muscle spasms. 02/13/15   Mercedes Camprubi-Soms, PA-C  fluconazole (DIFLUCAN) 150 MG tablet Take 1 tablet (150 mg total) by mouth once. May repeat x 1 72 hours later. 08/03/14   Verdis Frederickson Cook, DO  fluticasone (FLONASE) 50 MCG/ACT nasal spray Place 2 sprays into both nostrils daily. 02/22/15   Coffey N Rumley, DO  HYDROcodone-acetaminophen (NORCO) 5-325 MG per tablet Take 1 tablet by mouth every 6 (six) hours as needed for severe pain. 02/13/15   Mercedes Camprubi-Soms, PA-C  ketotifen (ZADITOR) 0.025 % ophthalmic solution Place 1 drop into both eyes 2 (two) times daily. 08/07/13   Hayden Rasmussen, NP  losartan-hydrochlorothiazide (HYZAAR) 100-12.5 MG tablet Take 1 tablet by mouth daily. 06/28/15   [place] N Rumley, DO  metroNIDAZOLE (FLAGYL) 500 MG tablet Take 1 tablet (500 mg total) by mouth 2 (two) times daily. 08/03/14   Jayce G Cook, DO  montelukast (SINGULAIR) 10 MG tablet Take 1 tablet (10 mg total) by mouth daily. 02/22/15   Araceli Bouche, DO  naproxen (NAPROSYN) 500 MG tablet Take 1 tablet (500 mg total) by mouth 2 (two) times daily as needed for mild pain, moderate pain or headache (TAKE WITH MEALS.). 02/13/15   Mercedes Camprubi-Soms, PA-C  naproxen (NAPROSYN) 500 MG tablet Take 1 tablet (500 mg total) by mouth 2 (two) times daily as needed. For joint pain 02/22/15   [place] N Rumley, DO  olopatadine (PATANOL) 0.1 % ophthalmic solution Place 1 drop into the right eye 2 (two) times daily.  10/05/14   Tommie Sams, DO  ondansetron (ZOFRAN ODT) 4 MG disintegrating tablet Take 1 tablet (4 mg total) by mouth every 8 (eight) hours as needed for nausea or vomiting. 08/14/15   Smitty Cords, DO  oxymetazoline (AFRIN NASAL SPRAY) 0.05 % nasal spray Place 1 spray into both nostrils 2 (two) times daily as needed for congestion. 07/14/13   Tyrone Nine, MD  potassium chloride SA (K-DUR,KLOR-CON) 20 MEQ tablet Take 1 tablet (20 mEq total) by mouth daily. 08/14/15   Alexander J Karamalegos, DO   BP 108/60 mmHg  Pulse 88  Temp(Src) 97.8 F (36.6 C) (Oral)  Resp 28  SpO2 100% Physical Exam  Constitutional: She is oriented to person, place, and time. She appears well-developed and well-nourished. No distress.  Ill-appearing and tired, cooperative, uncomfortable due to generalized aches, seems axious  HENT:  Head: Normocephalic and atraumatic.  Mouth/Throat: Oropharynx is clear and moist.  Sinuses non-tender, nares patent without congestion, oropharynx mild dry mucus mem and tongue  Eyes: Conjunctivae and EOM are normal. Pupils are equal, round, and reactive to light.  Neck: Normal range of motion. Neck supple. No thyromegaly present.  Cardiovascular: Normal rate, regular rhythm, normal heart sounds and intact distal pulses.   No murmur heard. Pulmonary/Chest: Breath sounds normal. No respiratory distress. She has no wheezes. She has no rales. She exhibits tenderness (mild bilateral lower ribcage tenderness to palpation).  Anxious with tachypnea, improved on repeat exam following IVF and X-ray  Abdominal: Soft. Bowel sounds are normal. She exhibits no distension and no mass. There is no tenderness. There is no rebound and no guarding.  Musculoskeletal: Normal range of motion. She exhibits no edema or tenderness.  Lymphadenopathy:    She has no cervical adenopathy.  Neurological: She is alert and oriented to person, place, and time.  Skin: Skin is warm and dry. No rash noted. She is  not diaphoretic.  Nursing note and vitals reviewed.   ED Course  Procedures (including critical care time) Labs Review Labs Reviewed  COMPREHENSIVE METABOLIC PANEL - Abnormal; Notable for the following:    Sodium 125 (*)    Potassium 2.5 (*)    Chloride 90 (*)    CO2 19 (*)    Glucose, Bld 131 (*)    Creatinine, Ser 1.21 (*)    Calcium 8.3 (*)    Total Protein 6.2 (*)    GFR calc non Af Amer 48 (*)    GFR calc Af Amer 56 (*)    Anion gap 16 (*)    All other components within normal limits  I-STAT CG4 LACTIC ACID, ED - Abnormal; Notable for the following:    Lactic Acid, Venous 2.13 (*)    All other components within normal limits  LIPASE, BLOOD  CBC  URINALYSIS, ROUTINE W REFLEX MICROSCOPIC (NOT AT [facility])  I-STAT TROPOININ, ED  I-STAT CG4 LACTIC ACID, ED    Imaging Review Dg Chest 2 View  08/14/2015  CLINICAL DATA:  Shortness of breath  with nausea and vomiting EXAM: CHEST  2 VIEW COMPARISON:  None. FINDINGS: There is no edema or consolidation. The heart is upper normal in size with pulmonary vascularity within normal limits. No adenopathy. There is degenerative change in the thoracic spine. IMPRESSION: No edema or consolidation. Electronically Signed   By: Bretta Bang III M.D.   On: 08/14/2015 15:02   I have personally reviewed and evaluated these images and lab results as part of my medical decision-making.   EKG Interpretation   Date/Time:  Monday August 14 2015 13:47:52 EST Ventricular Rate:  78 PR Interval:  126 QRS Duration: 94 QT Interval:  438 QTC Calculation: 499 R Axis:   73 Text Interpretation:  Normal sinus rhythm Baseline wander No significant  change since last tracing Confirmed by KNOTT MD, DANIEL ([id number]) on  08/14/2015 3:06:05 PM      MDM   Final diagnoses:  Moderate dehydration  Non-intractable vomiting with nausea, vomiting of unspecified type  Hypokalemia  Hyponatremia   60 yr Female without significant PMH presents to ED with  prolonged viral illness with flu-like and viral gastroenteritis symptoms, last seen 08/09/15 with same symptoms, positive influenza children contacts, poor PO but tolerating liquids, never filled prior rx Zofran ODT. Clinically in ED hemodynamically stable, HR stable but tachypnea to RR 32 with anxiety and generalized discomfort. Initial triage work-up with CMET, CBC, i-stat troponin, CXR, EKG, reviewed results CXR negative for infiltrate or edema, EKG without acute ischemic ST/T wave changes.  Suspected moderately dehydrated clinically secondary to viral illness with possible influenza, viral gastroenteritis, found to be hypokalemic to K 2.5, hyponatremia 125, suggestive of GI losses, and likely AKI with elevated Cr 1.21. Ordered IVF bolus 1L x 2, Zofran  IV x 1, followed by Kdur potassium x 1.  UPDATE 1545 Significant interval improvement clinically after most of first 1L bolus and Zofran IV x 1 dose. Resolved nausea, now has appetite. Will offer food, fluids, repeat 1L bolus. If tolerating PO, will anticipate discharge to home, supportive care, already has Zofran ODT rx at home, and will recommend close follow-up with PCP for repeat BMET within 1 week.  UPDATE 1615 Patient vomited potassium pills and some bites of food, she tried to eat a sandwich, but overall still feeling much better.  Stable to discharge home, with rx Zofran ODT and Kdur daily x 3-5 days, improve hydration, slowly advance diet, return precautions given. Follow-up with PCP within 1 week for repeat labs to re-check Na, K, Cr.  Smitty Cords, DO 08/14/15 1653  Lyndal Pulley, MD 08/14/15 [phone]

## 2015-08-14 NOTE — Discharge Instructions (Signed)
You were evaluated in the Emergency Department for Flu-like illness also likely Viral Gastroenteritis. Important to continue hydration, may start adding some G2 Gatorade to replace electrolytes, drink small sips only throughout the day May slowly advance your diet, probably wait 24 hours before trying any substantial food, soup, crackers, bananas are fine Take Zofran oral dissolving tablet one every 8 hours as needed for nausea, vomiting, don't swallow it just let it dissolve in your mouth Also given rx for Potassium tablets, take one daily for next 3 days. If you are able to eat regular diet or more solid foods by 3 days then stop taking the potassium after 3rd dose, but if you still have nausea, vomiting, or diarrhea and not eating regularly, you may finish the potassium tablets, through day 5. Recommend to start taking Tylenol Extra Strength  tabs - take 1 to 2 tabs (max  per dose) every 6 hours for pain (take regularly, don't skip a dose for next 3-7 days), max 24 hour daily dose is 6 to 8 tablets or 3000 to   If symptoms are worsening, with persistent fevers after 3-5 days, worsening or increased vomiting, persistent diarrhea, you may return to ED.  Please schedule follow-up with Dr Caroleen Hamman at [facility] within 1 week to re-check your blood work in clinic, to re-check kidneys and sodium, potassium.

## 2015-09-07 ENCOUNTER — Ambulatory Visit: Admitting: Family Medicine

## 2015-09-15 ENCOUNTER — Ambulatory Visit: Admitting: Family Medicine

## 2015-10-11 ENCOUNTER — Other Ambulatory Visit: Payer: Self-pay | Admitting: Family Medicine

## 2015-10-11 DIAGNOSIS — I10 Essential (primary) hypertension: Secondary | ICD-10-CM

## 2015-10-11 NOTE — Telephone Encounter (Signed)
Refill request for losartan-hydrochlorothiazide. Send to [facility].

## 2015-10-12 MED ORDER — LOSARTAN POTASSIUM-HCTZ 100-12.5 MG PO TABS: 1.0000 | ORAL_TABLET | Freq: Every day | ORAL | Status: DC

## 2015-10-12 NOTE — Telephone Encounter (Signed)
30 day supply of medication sent to pharmacy. She needs to schedule an appointment to be seen for future refills so we can evaluate her HTN. If I am not available in clinic, she may be scheduled with one of my colleagues.

## 2015-10-17 MED ORDER — LOSARTAN POTASSIUM-HCTZ 100-12.5 MG PO TABS
1.0000 | ORAL_TABLET | Freq: Every day | ORAL | Status: DC
Start: 2015-10-17 — End: 2016-03-19

## 2015-10-17 NOTE — Telephone Encounter (Signed)
Contacted pt to inform her of below and she wanted to see if her PCP would send this to [facility] - [place], [place] - [address]  She stated that she knows that [facility] but she has to pay for it there and at the [facility] she gets it free forwarding to PCP. Lamonte SakaiZimmerman Rumple, April D, [place]

## 2015-10-17 NOTE — Telephone Encounter (Signed)
PT informed. Zimmerman Rumple, April D, CMA  

## 2015-10-17 NOTE — Telephone Encounter (Signed)
Done

## 2016-03-12 NOTE — Progress Notes (Deleted)
Subjective:     Patient ID: Melissa Lloyd, female   DOB: 1955-07-22, 60 y.o.   MRN: [id number]  HPI [name] is a 60yo female presenting today for medication refills for hypertension.  - Hypertensive medications include Losartan-HCTZ 100-12.5 daily - Last Pap Smear in 09/2011 with ASCUS, HPV not detected. Recommended repeating Pap smear in 2-3 years. Due in 2015-2016.  - Last mammogram 2004 - No history of colonoscopy. Previous notes document her stating she will never have this done.  Review of Systems     Objective:   Physical Exam     Assessment:     ***    Plan:     ***

## 2016-03-13 ENCOUNTER — Ambulatory Visit: Admitting: Family Medicine

## 2016-03-15 ENCOUNTER — Other Ambulatory Visit: Payer: Self-pay | Admitting: Family Medicine

## 2016-03-15 DIAGNOSIS — I10 Essential (primary) hypertension: Secondary | ICD-10-CM

## 2016-03-15 NOTE — Telephone Encounter (Signed)
Pt is calling to request a refill on her BP medication. jw

## 2016-03-19 MED ORDER — LOSARTAN POTASSIUM-HCTZ 100-12.5 MG PO TABS: 1.0000 | ORAL_TABLET | Freq: Every day | ORAL | 0 refills | Status: DC

## 2016-03-19 NOTE — Telephone Encounter (Signed)
LVM for pt to call back to inform her of below and to see about getting her scheduled for a visit. Lamonte SakaiZimmerman Rumple, April D, [place]

## 2016-03-19 NOTE — Telephone Encounter (Signed)
Refill for 2 weeks given. No further refills will be given without appointment.

## 2016-03-25 NOTE — Telephone Encounter (Signed)
Pt has appointment on 03/27/16 with PCP. Melissa Lloyd, Melissa Lloyd, [place]

## 2016-03-27 ENCOUNTER — Other Ambulatory Visit (HOSPITAL_COMMUNITY)
Admission: RE | Admit: 2016-03-27 | Discharge: 2016-03-27 | Disposition: A | Discharge status: Home / Self Care | Source: Ambulatory Visit | Attending: Family Medicine | Admitting: Family Medicine

## 2016-03-27 ENCOUNTER — Ambulatory Visit (INDEPENDENT_AMBULATORY_CARE_PROVIDER_SITE_OTHER): Admitting: Family Medicine

## 2016-03-27 VITALS — BP 141/60 | Temp 98.4°F | Wt 164.0 lb

## 2016-03-27 DIAGNOSIS — Z1211 Encounter for screening for malignant neoplasm of colon: Secondary | ICD-10-CM

## 2016-03-27 DIAGNOSIS — Z1151 Encounter for screening for human papillomavirus (HPV): Secondary | ICD-10-CM | POA: Diagnosis present

## 2016-03-27 DIAGNOSIS — J309 Allergic rhinitis, unspecified: Secondary | ICD-10-CM

## 2016-03-27 DIAGNOSIS — Z23 Encounter for immunization: Secondary | ICD-10-CM

## 2016-03-27 DIAGNOSIS — Z0000 Encounter for general adult medical examination without abnormal findings: Secondary | ICD-10-CM | POA: Diagnosis not present

## 2016-03-27 DIAGNOSIS — Z01411 Encounter for gynecological examination (general) (routine) with abnormal findings: Secondary | ICD-10-CM | POA: Diagnosis present

## 2016-03-27 DIAGNOSIS — R739 Hyperglycemia, unspecified: Secondary | ICD-10-CM | POA: Diagnosis not present

## 2016-03-27 DIAGNOSIS — Z124 Encounter for screening for malignant neoplasm of cervix: Secondary | ICD-10-CM

## 2016-03-27 DIAGNOSIS — I10 Essential (primary) hypertension: Secondary | ICD-10-CM | POA: Diagnosis not present

## 2016-03-27 MED ORDER — LOSARTAN POTASSIUM-HCTZ 100-12.5 MG PO TABS: 1.0000 | ORAL_TABLET | Freq: Every day | ORAL | 3 refills | Status: DC

## 2016-03-27 MED ORDER — MONTELUKAST SODIUM 10 MG PO TABS: 10.0000 mg | ORAL_TABLET | Freq: Every day | ORAL | 3 refills | Status: DC

## 2016-03-27 MED ORDER — FLUTICASONE PROPIONATE 50 MCG/ACT NA SUSP: 2.0000 | Freq: Every day | NASAL | 11 refills | Status: DC

## 2016-03-27 MED ORDER — KETOTIFEN FUMARATE 0.025 % OP SOLN: 1.0000 [drp] | Freq: Two times a day (BID) | OPHTHALMIC | 3 refills | Status: DC

## 2016-03-27 MED ORDER — OLOPATADINE HCL 0.1 % OP SOLN: 1.0000 [drp] | Freq: Two times a day (BID) | OPHTHALMIC | 3 refills | Status: DC

## 2016-03-27 NOTE — Progress Notes (Signed)
Subjective:     Patient ID: Melissa Lloyd, female   DOB: [birth year], 60 y.o.   MRN: [id number]  HPI Melissa Lloyd is a 60yo female presenting today for annual follow up of Hypertension. - Denies any new diagnoses, medications, allergies, family history, social history - Requests refills of Flonase, Zaditor, Losartan-HCTZ, Singulair, Naproxen, Patanol - Last Pap Smear in 09/2011 with ASCUS and negative HPV. Recommended retesting in 3 years (2016) - Last Mammogram 2004.  - Refuses colonoscopy. Requests 3 stool cards. - Hyperglycemia noted, with glucose 131 in 07/2015 - Hypokalemia noted to 2.5 in 07/2015. Reports this is when she was sick. - Last lipid panel 2009 - Denies headache, chest pain, shortness of breath, weakness, numbness - Nonsmoker  Review of Systems Per HPI    Objective:   Physical Exam  Constitutional: She appears well-developed and well-nourished. No distress.  HENT:  Head: Normocephalic and atraumatic.  Cardiovascular: Normal rate and regular rhythm.   No murmur heard. Pulmonary/Chest: Effort normal. No respiratory distress. She has no wheezes.  Abdominal: Soft. She exhibits no distension. There is no tenderness.  Musculoskeletal: She exhibits no edema.  Skin: No rash noted.  Psychiatric: She has a normal mood and affect. Her behavior is normal.      Assessment and Plan:     HYPERTENSION, BENIGN ESSENTIAL - Stable - Losartan-HCTZ refilled - Recheck CMP. Lab closed--to return for future labs.  Healthcare maintenance - Refusing colonoscopy. Stool cards given. - Pap smear today - Encouraged to go for mammogram - Will obtain Lipid Panel and A1C. Lab closed--future orders given. - Follow up in one year or earlier pending lab results.  Allergic rhinitis - Flonase, Zaditor, Singulair, and Patanol refilled

## 2016-03-27 NOTE — Assessment & Plan Note (Signed)
-   Refusing colonoscopy. Stool cards given. - Pap smear today - Encouraged to go for mammogram - Will obtain Lipid Panel and A1C. Lab closed--future orders given. - Follow up in one year or earlier pending lab results.

## 2016-03-27 NOTE — Patient Instructions (Addendum)
Thank you so much for coming to visit today! We will check your labs today. You will be mailed a letter with the results. Three stool cards were given today. Please bring these back so we can check for rectal bleeding. Please go for your mammogram. Medications were refilled. Follow up pending labs.  Dr. Caroleen Hammanumley

## 2016-03-27 NOTE — Assessment & Plan Note (Signed)
-   Flonase, Zaditor, Singulair, and Patanol refilled

## 2016-03-27 NOTE — Assessment & Plan Note (Signed)
-   Stable - Losartan-HCTZ refilled - Recheck CMP. Lab closed--to return for future labs.

## 2016-04-02 LAB — CYTOLOGY - PAP
Diagnosis: UNDETERMINED — AB
HPV (WINDOPATH): NOT DETECTED

## 2016-04-04 ENCOUNTER — Telehealth: Payer: Self-pay | Admitting: Family Medicine

## 2016-04-04 NOTE — Telephone Encounter (Signed)
Contacted concerning Pap Smear results. Recommend colposcopy. Discussed procedure and addressed patient questions. Patient agrees to call and schedule.

## 2016-04-18 ENCOUNTER — Ambulatory Visit (INDEPENDENT_AMBULATORY_CARE_PROVIDER_SITE_OTHER): Admitting: Family Medicine

## 2016-04-18 VITALS — BP 120/70 | HR 83 | Temp 98.7°F | Ht 61.0 in | Wt 164.0 lb

## 2016-04-18 DIAGNOSIS — R87610 Atypical squamous cells of undetermined significance on cytologic smear of cervix (ASC-US): Secondary | ICD-10-CM | POA: Diagnosis not present

## 2016-04-18 NOTE — Patient Instructions (Addendum)
It was nice seeing you today. Your colposcopic exam looks fine. Please repeat PAP in 1 yr. F/U as needed.  Colposcopy Care After Colposcopy is a procedure in which a special tool is used to magnify the surface of the cervix. A tissue sample (biopsy) may also be taken. This sample will be looked at for cervical cancer or other problems. After the test:  You may have some cramping.  Lie down for a few minutes if you feel lightheaded.   You may have some bleeding which should stop in a few days. HOME CARE  Do not have sex or use tampons for 2 to 3 days or as told.  Only take medicine as told by your doctor.  Continue to take your birth control pills as usual. Finding out the results of your test Ask when your test results will be ready. Make sure you get your test results. GET HELP RIGHT AWAY IF:  You are bleeding a lot or are passing blood clots.  You develop a fever of 102 F (38.9 C) or higher.  You have abnormal vaginal discharge.  You have cramps that do not go away with medicine.  You feel lightheaded, dizzy, or pass out (faint). MAKE SURE YOU:   Understand these instructions.  Will watch your condition.  Will get help right away if you are not doing well or get worse.   This information is not intended to replace advice given to you by your health care provider. Make sure you discuss any questions you have with your health care provider.   Document Released: 11/20/2007 Document Revised: 08/26/2011 Document Reviewed: 12/31/2012 Elsevier Interactive Patient Education [employer]2016 Elsevier Inc.
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
## 2016-06-03 ENCOUNTER — Ambulatory Visit: Payer: Self-pay | Attending: Internal Medicine

## 2016-08-22 ENCOUNTER — Ambulatory Visit (INDEPENDENT_AMBULATORY_CARE_PROVIDER_SITE_OTHER): Admitting: Internal Medicine

## 2016-08-22 VITALS — BP 126/66 | HR 65 | Temp 97.9°F | Ht 61.0 in | Wt 164.0 lb

## 2016-08-22 DIAGNOSIS — J3489 Other specified disorders of nose and nasal sinuses: Secondary | ICD-10-CM | POA: Diagnosis not present

## 2016-08-22 DIAGNOSIS — Z23 Encounter for immunization: Secondary | ICD-10-CM | POA: Diagnosis not present

## 2016-08-22 DIAGNOSIS — R7989 Other specified abnormal findings of blood chemistry: Secondary | ICD-10-CM | POA: Diagnosis not present

## 2016-08-22 DIAGNOSIS — J329 Chronic sinusitis, unspecified: Secondary | ICD-10-CM | POA: Diagnosis not present

## 2016-08-22 DIAGNOSIS — I10 Essential (primary) hypertension: Secondary | ICD-10-CM

## 2016-08-22 LAB — BASIC METABOLIC PANEL WITH GFR
BUN: 9 mg/dL (ref 7–25)
CO2: 26 mmol/L (ref 20–31)
Calcium: 9.4 mg/dL (ref 8.6–10.4)
Chloride: 102 mmol/L (ref 98–110)
Creat: 0.75 mg/dL (ref 0.50–1.05)
GFR, Est Non African American: 88 mL/min (ref >=60)
GLUCOSE: 95 mg/dL (ref 65–99)
POTASSIUM: 3.8 mmol/L (ref 3.5–5.3)
SODIUM: 139 mmol/L (ref 135–146)

## 2016-08-22 MED ORDER — AMOXICILLIN-POT CLAVULANATE 875-125 MG PO TABS: 1.0000 | ORAL_TABLET | Freq: Two times a day (BID) | ORAL | 0 refills | Status: DC

## 2016-08-22 NOTE — Patient Instructions (Signed)
Ms. Melissa Lloyd,  I suspect you had a virus, but given your ongoing left-sided sinus pain, I am prescribing augmentin in case of sinusitis.  For your teeth, please see the attached handout on dental resources for options.  Afrin is a great nasal spray, but do not use it for more than 3 days at a time, as it can cause rebound congestion.   Best, Dr. Sampson GoonFitzgerald

## 2016-08-22 NOTE — Progress Notes (Signed)
[facility] Progress Note  Subjective:  Melissa Lloyd is a 61 y.o. female with history of hypertension and arthritis who presents for SDA with concern for report of flu-like symptoms.  #Concern for flu: - Patient had fever, chills, sore throat, with n/v/d for 2-3 days -- from Sunday through Tuesday - Now improving after taking tylenol and ibuprofen, following a bland diet, and drinking lots of water, as well as gatorade and gingerale - Has not had fever in a couple days now - What bothers her most is continued nasal congestion and sinus pain (L > R) that has been ongoing for several weeks.  - Has associated headaches - Is concerned sinus pain is actually coming from her teeth. Needs teeth pulled but has not found an affordable dentist and has insurance so does not qualify for orange card.  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 They all had colds over the weekend.  ROS: No myalgias, no cough  Patient is also requesting refills on her medications flonase, singulair, losartan-hctz, naproxen, and zaditor.   No Known Allergies  Objective: Blood pressure 126/66, pulse 65, temperature 97.9 F (36.6 C), temperature source Oral, height 5\' 1"  (1.549 m), weight 164 lb (74.4 kg), last menstrual period 07/19/2011, SpO2 99 %. Constitutional: Well-appearing female, in NAD HENT: Erythematous and swollen nasal turbinates. No erythema of posterior oropharynx. Can transilluminate the sinuses but tenderness to percussion over L frontal and maxillary sinuses. TMs normal bilaterally. Poor dentition with broken teeth/crowns of L lower molars but no signs of pus or inflamed gums.  Cardiovascular: RRR, S1, S2, no m/r/g.  Pulmonary/Chest: Effort normal and breath sounds normal. No respiratory distress.  Vitals reviewed  Assessment/Plan: Sinus pain - Patient's complaint of cold symptoms has since  resolved. Now more concerned about continued sinus pain. Will treat as sinusitis with augmentin given nasal drainage and focal sinus pain > 2 weeks, but facial pain may be secondary to dental issues. - Provided handout on discounted dental services - Counseled patient not to use afrin for more than 3 days due to rebound congestion that can occur  HYPERTENSION, BENIGN ESSENTIAL - Well controlled today. Will check BMP to check potassium and SCr.   Meds ordered this encounter  Medications  . amoxicillin-clavulanate (AUGMENTIN) 875-125 MG tablet    Sig: Take 1 tablet by mouth 2 (two) times daily.    Dispense:  20 tablet    Refill:  0  . fluticasone (FLONASE) 50 MCG/ACT nasal spray    Sig: Place 2 sprays into both nostrils daily.    Dispense:  16 g    Refill:  11  . ketotifen (ZADITOR) 0.025 % ophthalmic solution    Sig: Place 1 drop into both eyes 2 (two) times daily.    Dispense:  5 mL    Refill:  3  . losartan-hydrochlorothiazide (HYZAAR) 100-12.5 MG tablet    Sig: Take 1 tablet by mouth daily.    Dispense:  90 tablet    Refill:  3  . montelukast (SINGULAIR) 10 MG tablet    Sig: Take 1 tablet (10 mg total) by mouth daily.    Dispense:  90 tablet    Refill:  3  . naproxen (NAPROSYN) 500 MG tablet    Sig: Take 1 tablet (500 mg total) by mouth 2 (two) times daily as needed for mild pain, moderate pain or headache (TAKE WITH  MEALS.).    Dispense:  20 tablet    Refill:  1   Follow-up prn.  Dani GobbleHillary Tishanna Dunford, MD [facility], PGY-2

## 2016-08-24 DIAGNOSIS — J3489 Other specified disorders of nose and nasal sinuses: Secondary | ICD-10-CM | POA: Insufficient documentation

## 2016-08-24 MED ORDER — KETOTIFEN FUMARATE 0.025 % OP SOLN: 1.0000 [drp] | Freq: Two times a day (BID) | OPHTHALMIC | 3 refills | Status: DC

## 2016-08-24 MED ORDER — MONTELUKAST SODIUM 10 MG PO TABS: 10.0000 mg | ORAL_TABLET | Freq: Every day | ORAL | 3 refills | Status: DC

## 2016-08-24 MED ORDER — FLUTICASONE PROPIONATE 50 MCG/ACT NA SUSP: 2.0000 | Freq: Every day | NASAL | 11 refills | Status: DC

## 2016-08-24 MED ORDER — LOSARTAN POTASSIUM-HCTZ 100-12.5 MG PO TABS: 1.0000 | ORAL_TABLET | Freq: Every day | ORAL | 3 refills | Status: DC

## 2016-08-24 MED ORDER — NAPROXEN 500 MG PO TABS: 500.0000 mg | ORAL_TABLET | Freq: Two times a day (BID) | ORAL | 1 refills | Status: DC | PRN

## 2016-08-24 NOTE — Assessment & Plan Note (Addendum)
-   Patient's complaint of cold symptoms has since resolved. Now more concerned about continued sinus pain. Will treat as sinusitis with augmentin given nasal drainage and focal sinus pain > 2 weeks, but facial pain may be secondary to dental issues. - Provided handout on discounted dental services - Counseled patient not to use afrin for more than 3 days due to rebound congestion that can occur

## 2016-08-24 NOTE — Assessment & Plan Note (Signed)
-   Well controlled today. Will check BMP to check potassium and SCr.

## 2016-08-25 ENCOUNTER — Encounter: Payer: Self-pay | Admitting: Internal Medicine

## 2016-09-26 ENCOUNTER — Telehealth: Payer: Self-pay | Admitting: Family Medicine

## 2016-09-26 NOTE — Telephone Encounter (Signed)
Pt called because she is at her dentist office and they need to have a list of all the medications she is on. Please fax this to [phone], she is there now. jw

## 2016-09-26 NOTE — Telephone Encounter (Signed)
Medication list faxed to patient's dentist office.  Clovis Pu, RN
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
## 2017-02-27 ENCOUNTER — Ambulatory Visit (INDEPENDENT_AMBULATORY_CARE_PROVIDER_SITE_OTHER): Admitting: *Deleted

## 2017-02-27 DIAGNOSIS — Z23 Encounter for immunization: Secondary | ICD-10-CM

## 2017-03-28 ENCOUNTER — Ambulatory Visit (INDEPENDENT_AMBULATORY_CARE_PROVIDER_SITE_OTHER): Admitting: *Deleted

## 2017-03-28 DIAGNOSIS — Z111 Encounter for screening for respiratory tuberculosis: Secondary | ICD-10-CM

## 2017-03-28 NOTE — Progress Notes (Signed)
Tuberculin skin test applied to left ventral forearm.  Patient informed to schedule appt for nurse visit in 48-72 hours to have site read on Monday (03/31/17).  Altamese Dilling, BSN, RN-BC

## 2017-03-31 ENCOUNTER — Ambulatory Visit (INDEPENDENT_AMBULATORY_CARE_PROVIDER_SITE_OTHER): Admitting: *Deleted

## 2017-03-31 DIAGNOSIS — Z111 Encounter for screening for respiratory tuberculosis: Secondary | ICD-10-CM

## 2017-03-31 LAB — TB SKIN TEST
Induration: 0 mm
TB Skin Test: NEGATIVE

## 2017-03-31 NOTE — Progress Notes (Signed)
   PPD Reading Note PPD read and results entered in EpicCare. Result: 0 mm induration. Interpretation: Negative If test not read within 48-72 hours of initial placement, patient advised to repeat in other arm 1-3 weeks after this test. Allergic reaction: no  Martin, Tamika L, RN  

## 2017-04-04 ENCOUNTER — Ambulatory Visit

## 2017-04-21 ENCOUNTER — Ambulatory Visit (HOSPITAL_COMMUNITY)
Admission: EM | Admit: 2017-04-21 | Discharge: 2017-04-21 | Disposition: A | Discharge status: Home / Self Care | Attending: Emergency Medicine | Admitting: Emergency Medicine

## 2017-04-21 ENCOUNTER — Encounter (HOSPITAL_COMMUNITY): Payer: Self-pay | Admitting: Emergency Medicine

## 2017-04-21 DIAGNOSIS — S86019A Strain of unspecified Achilles tendon, initial encounter: Secondary | ICD-10-CM

## 2017-04-21 DIAGNOSIS — J9801 Acute bronchospasm: Secondary | ICD-10-CM | POA: Diagnosis not present

## 2017-04-21 DIAGNOSIS — W19XXXA Unspecified fall, initial encounter: Secondary | ICD-10-CM | POA: Diagnosis not present

## 2017-04-21 DIAGNOSIS — M25531 Pain in right wrist: Secondary | ICD-10-CM | POA: Diagnosis not present

## 2017-04-21 DIAGNOSIS — T07XXXA Unspecified multiple injuries, initial encounter: Secondary | ICD-10-CM | POA: Diagnosis not present

## 2017-04-21 DIAGNOSIS — S86011A Strain of right Achilles tendon, initial encounter: Secondary | ICD-10-CM | POA: Diagnosis not present

## 2017-04-21 MED ORDER — PREDNISONE 50 MG PO TABS: ORAL_TABLET | ORAL | 0 refills | Status: DC

## 2017-04-21 MED ORDER — ALBUTEROL SULFATE HFA 108 (90 BASE) MCG/ACT IN AERS: 2.0000 | INHALATION_SPRAY | RESPIRATORY_TRACT | 0 refills | Status: DC | PRN
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
## 2017-04-21 NOTE — Discharge Instructions (Signed)
Place ice to the areas of pain and soreness. Wear the wrap around the wrist and the ankle for the next 2 or 3 days. albuterol inhaler 2 puffs every 4 hours as needed for wheezing and cough. Take the prednisone daily with food as directed. Follow-up with her primary care doctor as needed.
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
## 2017-06-23 ENCOUNTER — Telehealth: Payer: Self-pay | Admitting: Internal Medicine

## 2017-06-23 NOTE — Telephone Encounter (Signed)
Pt needs referral for mammogram. Got letter in the mail from our office stating she needs a mammogram before 1/15. Please advise

## 2017-06-25 ENCOUNTER — Other Ambulatory Visit: Payer: Self-pay | Admitting: Internal Medicine

## 2017-06-25 DIAGNOSIS — Z1239 Encounter for other screening for malignant neoplasm of breast: Secondary | ICD-10-CM

## 2017-06-25 NOTE — Progress Notes (Signed)
Order placed for mammogram.   Marcy Sirenatherine Wallace, D.O. 06/25/2017, 11:33 AM PGY-3, [facility]

## 2017-08-13 ENCOUNTER — Telehealth: Payer: Self-pay | Admitting: Internal Medicine

## 2017-08-13 DIAGNOSIS — I10 Essential (primary) hypertension: Secondary | ICD-10-CM

## 2017-08-13 NOTE — Telephone Encounter (Signed)
Spoke to pt. She would like a 30 day supply of losartan-hydrochlorothiazide sent to [facility] [address] and the rest of the refills sent to [facility]. She is completely out and it takes the mail order a while to send it out. Also pt needs a refill of flonase and Singulair. Sunday SpillersSharon T Yitzchak Kothari, CMA

## 2017-08-13 NOTE — Telephone Encounter (Signed)
Pt said she needs refill on her losartan and that she also needs the number to the mail delivery pharmacy that the dr sends it to.

## 2017-08-18 MED ORDER — FLUTICASONE PROPIONATE 50 MCG/ACT NA SUSP: 2.0000 | Freq: Every day | NASAL | 11 refills | Status: DC

## 2017-08-18 MED ORDER — LOSARTAN POTASSIUM-HCTZ 100-12.5 MG PO TABS: 1.0000 | ORAL_TABLET | Freq: Every day | ORAL | 0 refills | Status: DC

## 2017-08-18 MED ORDER — MONTELUKAST SODIUM 10 MG PO TABS: 10.0000 mg | ORAL_TABLET | Freq: Every day | ORAL | 3 refills | Status: DC

## 2017-08-19 ENCOUNTER — Other Ambulatory Visit: Payer: Self-pay | Admitting: Internal Medicine

## 2017-08-19 MED ORDER — MONTELUKAST SODIUM 10 MG PO TABS: 10.0000 mg | ORAL_TABLET | Freq: Every day | ORAL | 3 refills | Status: DC

## 2017-08-19 MED ORDER — ALBUTEROL SULFATE HFA 108 (90 BASE) MCG/ACT IN AERS: 2.0000 | INHALATION_SPRAY | RESPIRATORY_TRACT | 0 refills | Status: DC | PRN

## 2017-08-19 MED ORDER — VALSARTAN 40 MG PO TABS: 40.0000 mg | ORAL_TABLET | Freq: Every day | ORAL | 0 refills | Status: DC

## 2017-08-19 MED ORDER — HYDROCHLOROTHIAZIDE 12.5 MG PO TABS: 12.5000 mg | ORAL_TABLET | Freq: Every day | ORAL | 0 refills | Status: DC

## 2017-08-19 MED ORDER — FLUTICASONE PROPIONATE 50 MCG/ACT NA SUSP: 2.0000 | Freq: Every day | NASAL | 11 refills | Status: DC

## 2017-08-19 NOTE — Progress Notes (Signed)
I have sent patients medication in 90 day supplies to her mail pharmacy. I had to split her HCTZ-Losartan into HCTZ and Valsartan (in place of Losartan) separate tablets. She has not had a check up for her chronic medical conditions for quite some time and needs to be seen.   Melissa Lloyd, D.O. 08/19/2017, 12:12 PM PGY-3, [facility]

## 2017-08-19 NOTE — Progress Notes (Signed)
Melissa Lloyd informed. Melissa Lloyd wanted to let Dr. Earlene PlaterWallace know that she thinks the HCTZ-Losartan has weakened her hair. Her hair was breaking off. Sunday SpillersSharon T Tristen Luce, CMA

## 2017-08-19 NOTE — Telephone Encounter (Signed)
Pt calling again about her meds. She said the pharmacy never received the one refill and I don't know if she knows if the [facility] received the others. If the losartan is recalled, she needs another medication. She said she needs all of her meds refilled with 90 day supplies. Contact patient with any questions, because she has been out of her meds for a week now. Please advise

## 2017-09-01 ENCOUNTER — Telehealth: Payer: Self-pay | Admitting: Internal Medicine

## 2017-09-01 NOTE — Telephone Encounter (Signed)
Results put up front for pt to pick up. Pt informed. Melissa Lloyd, CMA

## 2017-09-01 NOTE — Telephone Encounter (Signed)
Pt needs a copy of her TB test she has done in October. She would like to pick it up today. Let her know when it's been placed up front.

## 2017-10-11 ENCOUNTER — Other Ambulatory Visit: Payer: Self-pay | Admitting: Internal Medicine

## 2017-10-11 DIAGNOSIS — I10 Essential (primary) hypertension: Secondary | ICD-10-CM

## 2018-02-04 ENCOUNTER — Ambulatory Visit

## 2018-02-04 ENCOUNTER — Ambulatory Visit: Admitting: Family Medicine

## 2018-03-05 ENCOUNTER — Encounter: Admitting: Family Medicine

## 2018-03-19 ENCOUNTER — Other Ambulatory Visit: Payer: Self-pay

## 2018-03-19 ENCOUNTER — Ambulatory Visit (INDEPENDENT_AMBULATORY_CARE_PROVIDER_SITE_OTHER): Admitting: Family Medicine

## 2018-03-19 ENCOUNTER — Encounter: Payer: Self-pay | Admitting: Family Medicine

## 2018-03-19 DIAGNOSIS — K029 Dental caries, unspecified: Secondary | ICD-10-CM

## 2018-03-19 DIAGNOSIS — Z1239 Encounter for other screening for malignant neoplasm of breast: Secondary | ICD-10-CM

## 2018-03-19 DIAGNOSIS — K047 Periapical abscess without sinus: Secondary | ICD-10-CM

## 2018-03-19 DIAGNOSIS — Z1211 Encounter for screening for malignant neoplasm of colon: Secondary | ICD-10-CM

## 2018-03-19 DIAGNOSIS — I10 Essential (primary) hypertension: Secondary | ICD-10-CM

## 2018-03-19 DIAGNOSIS — Z1159 Encounter for screening for other viral diseases: Secondary | ICD-10-CM | POA: Insufficient documentation

## 2018-03-19 DIAGNOSIS — Z0000 Encounter for general adult medical examination without abnormal findings: Secondary | ICD-10-CM

## 2018-03-19 DIAGNOSIS — N760 Acute vaginitis: Secondary | ICD-10-CM

## 2018-03-19 MED ORDER — VALSARTAN-HYDROCHLOROTHIAZIDE 80-12.5 MG PO TABS: 1.0000 | ORAL_TABLET | Freq: Every day | ORAL | 3 refills | Status: DC

## 2018-03-19 MED ORDER — MONTELUKAST SODIUM 10 MG PO TABS: 10.0000 mg | ORAL_TABLET | Freq: Every day | ORAL | 3 refills | Status: DC

## 2018-03-19 MED ORDER — FLUTICASONE PROPIONATE 50 MCG/ACT NA SUSP: 2.0000 | Freq: Every day | NASAL | 11 refills | Status: DC

## 2018-03-19 MED ORDER — PENICILLIN V POTASSIUM 500 MG PO TABS: 500.0000 mg | ORAL_TABLET | Freq: Two times a day (BID) | ORAL | 0 refills | Status: DC

## 2018-03-19 MED ORDER — ALBUTEROL SULFATE HFA 108 (90 BASE) MCG/ACT IN AERS: 2.0000 | INHALATION_SPRAY | RESPIRATORY_TRACT | 3 refills | Status: DC | PRN

## 2018-03-19 MED ORDER — KETOTIFEN FUMARATE 0.025 % OP SOLN: 1.0000 [drp] | Freq: Two times a day (BID) | OPHTHALMIC | 3 refills | Status: DC

## 2018-03-19 MED ORDER — FLUCONAZOLE 150 MG PO TABS: 150.0000 mg | ORAL_TABLET | Freq: Once | ORAL | 0 refills | Status: AC

## 2018-03-19 NOTE — Patient Instructions (Addendum)
I reordered all your meds. I will call with lab test results. I sent the itching medicine to [facility] I changed your blood pressure medicine to a combined pill.  Keep taking the losartan and hydrochlorothiazide until you get your new medicine.  When that comes, the two medicines will be combined in a single pill.   I also sent in a prescription for penicillin for your teeth. Get your mammogram Send back the poop cards. Recheck with [place] in 2-3 months to make sure the blood pressure is under control.

## 2018-03-20 ENCOUNTER — Encounter: Payer: Self-pay | Admitting: Family Medicine

## 2018-03-20 DIAGNOSIS — K047 Periapical abscess without sinus: Secondary | ICD-10-CM

## 2018-03-20 DIAGNOSIS — K029 Dental caries, unspecified: Secondary | ICD-10-CM | POA: Insufficient documentation

## 2018-03-20 LAB — CMP14+EGFR
ALBUMIN: 4.8 g/dL (ref 3.6–4.8)
ALK PHOS: 87 IU/L (ref 39–117)
ALT: 19 IU/L (ref 0–32)
AST: 19 IU/L (ref 0–40)
Albumin/Globulin Ratio: 1.8 (ref 1.2–2.2)
BUN / CREAT RATIO: 11 — AB (ref 12–28)
BUN: 8 mg/dL (ref 8–27)
Bilirubin Total: 0.9 mg/dL (ref 0.0–1.2)
CO2: 24 mmol/L (ref 20–29)
CREATININE: 0.73 mg/dL (ref 0.57–1.00)
Calcium: 9.9 mg/dL (ref 8.7–10.3)
Chloride: 93 mmol/L — ABNORMAL LOW (ref 96–106)
GFR, EST AFRICAN AMERICAN: 104 mL/min/{1.73_m2} (ref >59)
GFR, EST NON AFRICAN AMERICAN: 90 mL/min/{1.73_m2} (ref >59)
GLOBULIN, TOTAL: 2.6 g/dL (ref 1.5–4.5)
Glucose: 105 mg/dL — ABNORMAL HIGH (ref 65–99)
Potassium: 3.6 mmol/L (ref 3.5–5.2)
SODIUM: 137 mmol/L (ref 134–144)
TOTAL PROTEIN: 7.4 g/dL (ref 6.0–8.5)

## 2018-03-20 LAB — CBC
Hematocrit: 39.1 % (ref 34.0–46.6)
Hemoglobin: 13.1 g/dL (ref 11.1–15.9)
MCH: 30.4 pg (ref 26.6–33.0)
MCHC: 33.5 g/dL (ref 31.5–35.7)
MCV: 91 fL (ref 79–97)
PLATELETS: 352 10*3/uL (ref 150–450)
RBC: 4.31 x10E6/uL (ref 3.77–5.28)
RDW: 11.5 % — AB (ref 12.3–15.4)
WBC: 7.7 10*3/uL (ref 3.4–10.8)

## 2018-03-20 LAB — LIPID PANEL
CHOLESTEROL TOTAL: 191 mg/dL (ref 100–199)
Chol/HDL Ratio: 3.3 ratio (ref 0.0–4.4)
HDL: 58 mg/dL (ref >39)
LDL Calculated: 115 mg/dL — ABNORMAL HIGH (ref 0–99)
Triglycerides: 90 mg/dL (ref 0–149)
VLDL Cholesterol Cal: 18 mg/dL (ref 5–40)

## 2018-03-20 LAB — HEPATITIS C ANTIBODY

## 2018-03-20 NOTE — Assessment & Plan Note (Signed)
emipiric Rx for candida

## 2018-03-20 NOTE — Assessment & Plan Note (Signed)
Melissa Lloyd.  She knows that antibiotics are only temporizing measures and needs definitive dental care ASAP

## 2018-03-20 NOTE — Progress Notes (Signed)
   Subjective:    Patient ID: [name], female    DOB: [birth year], 62 y.o.   MRN: [id number]  HPI Annual exam, and more.  Issues 1. Ongoing dental caries.  Knows she needs extractions.  Cannot afford.  Getting periodic antibiotics and then need Rx for yeast. 2. Needs refill on all her meds.  Most go to mail order pharmacy.  Acute meds go to local pharmacy.   3. HBP.  Lack of clarity around BP meds.  Lists two ARBs and HCTZ x two. Clarified what I think she is taking.  She prefers combo pill.  Treated with Valsartan HCTZ. 4. HPDP multiple Due for Hep C screen.  Reordered mammo.  Does not want colonoscopy but will do fit testing.      Review of Systems No CP, SOB, abd pain, leg swelling, worrisome skin lesions.  Denies change in bowel, bladder, wt or appetite.     Objective:   Physical Exam HEENT horrible dentition Neck supple without nodes Lungs clear CArdiac RRR without m or g Abd benign. Ext no edema Neuro, Gait normal.  Motor and senory grossly intact.       Assessment & Plan:

## 2018-03-20 NOTE — Assessment & Plan Note (Signed)
Behind on multiple issues.  I did catch up as best I could

## 2018-03-20 NOTE — Assessment & Plan Note (Signed)
Clarified and simplified meds.

## 2018-03-24 ENCOUNTER — Ambulatory Visit
Admission: RE | Admit: 2018-03-24 | Discharge: 2018-03-24 | Disposition: A | Discharge status: Home / Self Care | Source: Ambulatory Visit | Attending: Family Medicine | Admitting: Family Medicine

## 2018-03-24 DIAGNOSIS — Z1239 Encounter for other screening for malignant neoplasm of breast: Secondary | ICD-10-CM

## 2018-03-25 ENCOUNTER — Other Ambulatory Visit: Payer: Self-pay | Admitting: Family Medicine

## 2018-03-25 DIAGNOSIS — R928 Other abnormal and inconclusive findings on diagnostic imaging of breast: Secondary | ICD-10-CM

## 2018-04-02 ENCOUNTER — Ambulatory Visit
Admission: RE | Admit: 2018-04-02 | Discharge: 2018-04-02 | Disposition: A | Discharge status: Home / Self Care | Source: Ambulatory Visit | Attending: Family Medicine | Admitting: Family Medicine

## 2018-04-02 ENCOUNTER — Other Ambulatory Visit: Payer: Self-pay | Admitting: Family Medicine

## 2018-04-02 DIAGNOSIS — R928 Other abnormal and inconclusive findings on diagnostic imaging of breast: Secondary | ICD-10-CM

## 2018-07-17 ENCOUNTER — Other Ambulatory Visit: Payer: Self-pay

## 2018-07-17 ENCOUNTER — Encounter (HOSPITAL_COMMUNITY): Payer: Self-pay

## 2018-07-17 ENCOUNTER — Ambulatory Visit (HOSPITAL_COMMUNITY)
Admission: EM | Admit: 2018-07-17 | Discharge: 2018-07-17 | Disposition: A | Discharge status: Home / Self Care | Attending: Internal Medicine | Admitting: Internal Medicine

## 2018-07-17 DIAGNOSIS — H0011 Chalazion right upper eyelid: Secondary | ICD-10-CM

## 2018-07-17 DIAGNOSIS — H01001 Unspecified blepharitis right upper eyelid: Secondary | ICD-10-CM

## 2018-07-17 DIAGNOSIS — K047 Periapical abscess without sinus: Secondary | ICD-10-CM

## 2018-07-17 DIAGNOSIS — H00011 Hordeolum externum right upper eyelid: Secondary | ICD-10-CM | POA: Diagnosis not present

## 2018-07-17 MED ORDER — CEPHALEXIN 500 MG PO CAPS: 1000.0000 mg | ORAL_CAPSULE | Freq: Two times a day (BID) | ORAL | 0 refills | Status: AC

## 2018-07-17 MED ORDER — BACITRACIN-POLYMYXIN B 500-10000 UNIT/GM OP OINT: 1.0000 "application " | TOPICAL_OINTMENT | Freq: Three times a day (TID) | OPHTHALMIC | 0 refills | Status: AC

## 2018-07-17 NOTE — Discharge Instructions (Signed)
Try to contact the community dental clinics and request cash pay estimates for the extraction you need done.

## 2018-07-17 NOTE — ED Triage Notes (Signed)
Pt arrives to UC with complaints of chronic dental pain from broken teeth, pt states she knows she needs oral surgery but cannot afford it. Pt also has swelling to bilateral eyelids from using mascara that she thinks she was allergic to on her birthday 11 days ago. Pt took benadryl for her eyelid swelling, and it went down some but she thinks they may have gotten infected. Pt denies vision changes or difficulty blinking.

## 2018-07-17 NOTE — ED Notes (Signed)
Patient verbalizes understanding of discharge instructions. Opportunity for questioning and answers were provided. Armband removed by staff, pt discharged from UC ambulatory.  
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
## 2018-10-01 ENCOUNTER — Telehealth: Payer: Self-pay

## 2018-10-01 ENCOUNTER — Other Ambulatory Visit: Payer: Self-pay

## 2018-10-01 ENCOUNTER — Ambulatory Visit (INDEPENDENT_AMBULATORY_CARE_PROVIDER_SITE_OTHER): Admitting: Family Medicine

## 2018-10-01 VITALS — BP 130/70 | HR 77 | Temp 98.5°F | Wt 168.2 lb

## 2018-10-01 DIAGNOSIS — H01001 Unspecified blepharitis right upper eyelid: Secondary | ICD-10-CM | POA: Diagnosis not present

## 2018-10-01 DIAGNOSIS — K047 Periapical abscess without sinus: Secondary | ICD-10-CM

## 2018-10-01 DIAGNOSIS — H01004 Unspecified blepharitis left upper eyelid: Secondary | ICD-10-CM | POA: Diagnosis not present

## 2018-10-01 DIAGNOSIS — K029 Dental caries, unspecified: Secondary | ICD-10-CM | POA: Diagnosis not present

## 2018-10-01 MED ORDER — DOUBLE ANTIBIOTIC 500-10000 UNIT/GM EX OINT: 1.0000 "application " | TOPICAL_OINTMENT | Freq: Two times a day (BID) | CUTANEOUS | 0 refills | Status: DC

## 2018-10-01 MED ORDER — FLUCONAZOLE 150 MG PO TABS: 150.0000 mg | ORAL_TABLET | Freq: Once | ORAL | 0 refills | Status: AC

## 2018-10-01 MED ORDER — PENICILLIN V POTASSIUM 500 MG PO TABS: 500.0000 mg | ORAL_TABLET | Freq: Two times a day (BID) | ORAL | 0 refills | Status: DC

## 2018-10-01 NOTE — Addendum Note (Signed)
Addended by: Manson Passey, Zachry Hopfensperger on: 10/01/2018 03:44 PM   Modules accepted: Level of Service

## 2018-10-01 NOTE — Assessment & Plan Note (Signed)
Patient with multiple dental caries in need of extraction.  Has been unable to afford but said that she will contact dentist after the coronavirus outbreak.  Was prescribed in the past antibiotic in October to prevent worsening infection.  Will refill sent a prescription.  1 pill of Diflucan was also prescribed for likely vaginitis post antibiotic course.

## 2018-10-01 NOTE — Assessment & Plan Note (Signed)
Call pharmacy to confirm that patient was on antibiotic ointment.  Will prescribe polymyxin-bacitracin ointment to be used in both eyes for ongoing infection. Patient will follow-up as needed if see if still experiencing discomfort or swelling despite antibiotic ointment.Marland Kitchen
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
## 2018-10-01 NOTE — Telephone Encounter (Signed)
Called the pharmacy and made appropriate changes.  Thank you   Lovena Neighbours, MD [facility], PGY-3

## 2018-10-01 NOTE — Telephone Encounter (Signed)
Pharmacy called requesting the antibiotic that was recently sent in to be changed to eye drop form. Please advise.

## 2018-10-06 ENCOUNTER — Encounter

## 2018-10-06 ENCOUNTER — Other Ambulatory Visit

## 2018-10-08 ENCOUNTER — Encounter

## 2018-10-08 ENCOUNTER — Other Ambulatory Visit

## 2018-11-02 ENCOUNTER — Encounter

## 2018-11-02 ENCOUNTER — Other Ambulatory Visit

## 2018-11-16 ENCOUNTER — Other Ambulatory Visit: Payer: Self-pay | Admitting: Family Medicine

## 2018-11-16 ENCOUNTER — Other Ambulatory Visit: Payer: Self-pay

## 2018-11-16 ENCOUNTER — Ambulatory Visit

## 2018-11-16 ENCOUNTER — Ambulatory Visit
Admission: RE | Admit: 2018-11-16 | Discharge: 2018-11-16 | Disposition: A | Discharge status: Home / Self Care | Source: Ambulatory Visit | Attending: Family Medicine | Admitting: Family Medicine

## 2018-11-16 DIAGNOSIS — R928 Other abnormal and inconclusive findings on diagnostic imaging of breast: Secondary | ICD-10-CM

## 2018-12-15 ENCOUNTER — Other Ambulatory Visit: Payer: Self-pay | Admitting: *Deleted

## 2018-12-15 DIAGNOSIS — H01001 Unspecified blepharitis right upper eyelid: Secondary | ICD-10-CM

## 2018-12-15 DIAGNOSIS — H01004 Unspecified blepharitis left upper eyelid: Secondary | ICD-10-CM

## 2018-12-16 MED ORDER — FLUTICASONE PROPIONATE 50 MCG/ACT NA SUSP: 2.0000 | Freq: Every day | NASAL | 11 refills | Status: DC

## 2018-12-16 MED ORDER — ALBUTEROL SULFATE HFA 108 (90 BASE) MCG/ACT IN AERS: 2.0000 | INHALATION_SPRAY | RESPIRATORY_TRACT | 3 refills | Status: DC | PRN

## 2018-12-16 MED ORDER — DOUBLE ANTIBIOTIC 500-10000 UNIT/GM EX OINT: 1.0000 "application " | TOPICAL_OINTMENT | Freq: Two times a day (BID) | CUTANEOUS | 3 refills | Status: DC

## 2018-12-16 MED ORDER — VALSARTAN-HYDROCHLOROTHIAZIDE 80-12.5 MG PO TABS: 1.0000 | ORAL_TABLET | Freq: Every day | ORAL | 3 refills | Status: DC

## 2018-12-16 MED ORDER — MONTELUKAST SODIUM 10 MG PO TABS: 10.0000 mg | ORAL_TABLET | Freq: Every day | ORAL | 3 refills | Status: DC

## 2018-12-17 ENCOUNTER — Telehealth: Payer: Self-pay | Admitting: Family Medicine

## 2018-12-17 NOTE — Telephone Encounter (Signed)
Pt called and left a voice mail on my line request refills on all her medications that are through the mail. jw

## 2018-12-17 NOTE — Telephone Encounter (Signed)
Refills sent yesterday 

## 2019-01-20 ENCOUNTER — Other Ambulatory Visit: Payer: Self-pay

## 2019-01-20 ENCOUNTER — Other Ambulatory Visit (HOSPITAL_COMMUNITY)
Admission: RE | Admit: 2019-01-20 | Discharge: 2019-01-20 | Disposition: A | Discharge status: Home / Self Care | Source: Ambulatory Visit | Attending: Family Medicine | Admitting: Family Medicine

## 2019-01-20 ENCOUNTER — Ambulatory Visit (INDEPENDENT_AMBULATORY_CARE_PROVIDER_SITE_OTHER): Admitting: Family Medicine

## 2019-01-20 DIAGNOSIS — H01001 Unspecified blepharitis right upper eyelid: Secondary | ICD-10-CM | POA: Diagnosis not present

## 2019-01-20 DIAGNOSIS — Z113 Encounter for screening for infections with a predominantly sexual mode of transmission: Secondary | ICD-10-CM | POA: Insufficient documentation

## 2019-01-20 DIAGNOSIS — N898 Other specified noninflammatory disorders of vagina: Secondary | ICD-10-CM | POA: Diagnosis present

## 2019-01-20 DIAGNOSIS — K029 Dental caries, unspecified: Secondary | ICD-10-CM

## 2019-01-20 DIAGNOSIS — H01004 Unspecified blepharitis left upper eyelid: Secondary | ICD-10-CM

## 2019-01-20 DIAGNOSIS — Z1211 Encounter for screening for malignant neoplasm of colon: Secondary | ICD-10-CM

## 2019-01-20 DIAGNOSIS — A599 Trichomoniasis, unspecified: Secondary | ICD-10-CM

## 2019-01-20 DIAGNOSIS — K047 Periapical abscess without sinus: Secondary | ICD-10-CM

## 2019-01-20 LAB — POCT WET PREP (WET MOUNT): Clue Cells Wet Prep Whiff POC: POSITIVE

## 2019-01-20 MED ORDER — PENICILLIN V POTASSIUM 500 MG PO TABS: 500.0000 mg | ORAL_TABLET | Freq: Two times a day (BID) | ORAL | 0 refills | Status: DC

## 2019-01-20 MED ORDER — METRONIDAZOLE 500 MG PO TABS: 500.0000 mg | ORAL_TABLET | Freq: Two times a day (BID) | ORAL | 0 refills | Status: DC

## 2019-01-20 MED ORDER — METRONIDAZOLE 500 MG PO TABS: 500.0000 mg | ORAL_TABLET | Freq: Three times a day (TID) | ORAL | 0 refills | Status: DC

## 2019-01-20 NOTE — Progress Notes (Signed)
Subjective: Chief Complaint  Patient presents with  . Vaginal Discharge     HPI: Melissa Lloyd is a 63 y.o. presenting to clinic today to discuss the following:  1 Teeth Infection Patient has a longstanding history of multiple dental caries in need of extraction.  She has been dealing with this for the last 2 years, but has been unable to afford her co-pay.  She notes that she recently applied for new dental coverage that will come into effect in September, and then she will be able to see an oral surgeon to have this removed.  She has been prescribed antibiotics in the past to prevent worsening infection in the setting of not being able to have this extraction.  She was given a prescription for Veetid for 30 days, which patient finished, and she has been without antibiotics since the middle of May.  She notes that she continues to have pain in the area, redness, especially in her left lower teeth.  She denies any fevers, discharge in her mouth.  She states that she is still able to open her jaw without pain, eating and drinking normally.  2 Vaginal Discharge  Patient reports that discharge started about one week ago.  She notes that discharge appears yellow thick discharge.  She denies vaginal odors.  Endorses pruritis.  She deniesabnormal vaginal bleeding, dysuria, hematuria, pelvic pain, nausea, vomiting, fevers.   She has used diflucan with no relief.   Positive history of STIs.  She is sexually active, last encounter 1 month ago and does use condoms.  Contraception: none.  Patient's last menstrual period was 07/19/2011. She has douched this month and notes some irritation.  3 Eye Irritation Patient notes that she has had a history of bilateral upper eyelid swelling and irritation.  She states "sometimes I wake up in the morning and my eyes are crusted shut, and it hurts.  She has been treated for blepharitis in the past, most recently in April with good improvement.  She notes that she  has had bilateral gradual worsening in her vision over the last few years, and is planning to see an eye doctor for this, but denies any acute vision changes.  Health maintenance: needs colonscopy  ROS noted in HPI.   Past Medical, Surgical, Social, and Family History Reviewed & Updated per EMR.   Pertinent Historical Findings include:   Social History   Tobacco Use  Smoking Status Former Smoker  Smokeless Tobacco Never Used  Tobacco Comment   smoked years ago for 3-4 years      Objective: LMP 07/19/2011  Vitals and nursing notes reviewed  Physical Exam:  General: 63 y.o. female in NAD HEENT: pinpoint papules on bilateral upper eyelids without open lesion or erythema Lungs: Breathing comfortably on room air Skin: warm and dry Extremities: No edema, moves all 4 extremities equally GU: Pelvic exam performed with patient supine.  Chaperone in room.  Bilateral labia without abnormalities, no inguinal LAD palpated.  Cervix exhibits green frothy discharge, no cervix abnormalities.  No vaginal lesions.  Vaginal discharge large amount of green frothy discharge.   Results for orders placed or performed in visit on 01/20/19 (from the past 72 hour(s))  POCT Wet Prep [facility]([place])     Status: Abnormal   Collection Time: 01/20/19 10:10 AM  Result Value Ref Range   Source Wet Prep POC VAG    WBC, Wet Prep HPF POC TOO MANY TO COUNT    Bacteria Wet  Prep HPF POC Moderate (A) Few   Clue Cells Wet Prep HPF POC None None   Clue Cells Wet Prep Whiff POC Positive Whiff    Yeast Wet Prep HPF POC None None   Trichomonas Wet Prep HPF POC Present (A) Absent    Assessment/Plan:  Trichomonas infection Wet prep consistent with trichomonas.  Prescribed metronidazole 500 mg twice daily x7 days.  Patient advised to avoid intercourse until treatment is completed and to ensure that her partner is informed and treated as well. Partner's name [name], phone number [phone].  1 EPT bag given for  partner.  One partner in last two months.  She is unaware if he has an allergy to any medications.  She was advised to let him know he should also be tested for other STDs.  Infected dental carries Multiple dental caries that will require extraction from oral surgeon.  Has had difficulty affording this, but is planning to see an oral surgeon in September.  Has been prescribed previous antibiotics for this to prevent worsening of infection.  Will refill for 30 days until she is able to see the oral surgeon.  Patient advised that this needs appropriate intervention from oral surgeon, and encouraged her to be seen as soon as possible.  Vaginal discharge Wet prep consistent with trichomonas, will treat with metronidazole per above.  Gonorrhea/chlamydia pending.  Will notify patient of results.  Encounter for screening examination for sexually transmitted disease Wet prep consistent with trichomonas.  Patient also tested for gonorrhea/chlamydia a, HIV, RPR per patient request.  Will notify of results.  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  Patient requesting refill of previous ointment, noted that this had been refilled in July with 3 refills.  Patient made aware.  Patient advised to see optometrist as well for vision changes.  She is to follow-up if worsens or does not improve.  Colon cancer screening Patient is due for a colonoscopy, she declines.  She is requesting FIT testing.  Order placed, patient sent to lab.     PATIENT EDUCATION PROVIDED: See AVS    Diagnosis and plan along with any newly prescribed medication(s) were discussed in detail with this patient today. The patient verbalized understanding and agreed with the plan. Patient advised if symptoms worsen return to clinic or ER.   Health Maintainance: declines colonoscopy, requesting FIT test   Orders Placed This Encounter  Procedures  . Fecal occult blood, imunochemical([facility])  .  HIV antibody (with reflex)  . RPR  . POCT Wet Prep [facility])    Meds ordered this encounter  Medications  . DISCONTD: penicillin v potassium (VEETID) 500 MG tablet    Sig: Take 1 tablet (500 mg total) by mouth 2 (two) times daily.    Dispense:  60 tablet    Refill:  0  . DISCONTD: metroNIDAZOLE (FLAGYL) 500 MG tablet    Sig: Take 1 tablet (500 mg total) by mouth 3 (three) times daily.    Dispense:  21 tablet    Refill:  0  . DISCONTD: metroNIDAZOLE (FLAGYL) 500 MG tablet    Sig: Take 1 tablet (500 mg total) by mouth 3 (three) times daily.    Dispense:  21 tablet    Refill:  0  . penicillin v potassium (VEETID) 500 MG tablet    Sig: Take 1 tablet (500 mg total) by mouth 2 (two) times daily.    Dispense:  60 tablet    Refill:  0  . metroNIDAZOLE (FLAGYL) 500 MG tablet    Sig: Take 1 tablet (500 mg total) by mouth 2 (two) times daily.    Dispense:  21 tablet    Refill:  0     Luis AbedBailey Dona Klemann, DO 01/20/2019, 1:42 PM PGY-2 [facility]

## 2019-01-20 NOTE — Assessment & Plan Note (Addendum)
Wet prep consistent with trichomonas.  Patient also tested for gonorrhea/chlamydia a, HIV, RPR per patient request.  Will notify of results.

## 2019-01-20 NOTE — Assessment & Plan Note (Addendum)
Wet prep consistent with trichomonas.  Prescribed metronidazole 500 mg twice daily x7 days.  Patient advised to avoid intercourse until treatment is completed and to ensure that her partner is informed and treated as well. Partner's name [name], phone number [phone].  1 EPT bag given for partner.  One partner in last two months.  She is unaware if he has an allergy to any medications.  She was advised to let him know he should also be tested for other STDs.

## 2019-01-20 NOTE — Assessment & Plan Note (Signed)
Patient is due for a colonoscopy, she declines.  She is requesting FIT testing.  Order placed, patient sent to lab.

## 2019-01-20 NOTE — Patient Instructions (Signed)
Thank you for coming to see me today. It was a pleasure. Today we talked about:   Your discharge: You have trichomonas, we will treat you with an antibiotic for the next 7 days.  Please make sure that you take this twice a day every day until you complete the course.  You should abstain from intercourse until you are treated.  Please ensure that your partners are also treated and tested for other STDs.  We will call you with the results of your gonorrhea, chlamydia, syphilis, HIV test.  Your teeth pain: I have sent in a refill for the antibiotic for your teeth.  Please try to get into an oral surgeon as soon as possible.  Your eye discomfort: He has something called blepharitis.  I sent him to a prescription in the beginning of July for this, with 3 refills, you should have this available at your pharmacy.   If you have any questions or concerns, please do not hesitate to call the office at [phone].  Best,   [place] Constable, DO

## 2019-01-20 NOTE — Assessment & Plan Note (Signed)
Exam consistent with blepharitis.  Patient requesting refill of previous ointment, noted that this had been refilled in July with 3 refills.  Patient made aware.  Patient advised to see optometrist as well for vision changes.  She is to follow-up if worsens or does not improve.

## 2019-01-20 NOTE — Assessment & Plan Note (Addendum)
Multiple dental caries that will require extraction from oral surgeon.  Has had difficulty affording this, but is planning to see an oral surgeon in September.  Has been prescribed previous antibiotics for this to prevent worsening of infection.  Will refill for 30 days until she is able to see the oral surgeon.  Patient advised that this needs appropriate intervention from oral surgeon, and encouraged her to be seen as soon as possible.

## 2019-01-20 NOTE — Assessment & Plan Note (Signed)
Wet prep consistent with trichomonas, will treat with metronidazole per above.  Gonorrhea/chlamydia pending.  Will notify patient of results.

## 2019-01-21 LAB — CERVICOVAGINAL ANCILLARY ONLY
Chlamydia: NEGATIVE
Neisseria Gonorrhea: NEGATIVE

## 2019-01-21 LAB — HIV ANTIBODY (ROUTINE TESTING W REFLEX): HIV Screen 4th Generation wRfx: NONREACTIVE

## 2019-01-21 LAB — RPR: RPR Ser Ql: NONREACTIVE

## 2019-01-22 ENCOUNTER — Telehealth: Payer: Self-pay | Admitting: *Deleted

## 2019-01-22 LAB — FECAL OCCULT BLOOD, IMMUNOCHEMICAL: Fecal Occult Bld: NEGATIVE

## 2019-01-22 NOTE — Telephone Encounter (Signed)
Informed pt of below and also saw the the G/C was back and I told her they were negative as well.  Routing to PCP to let her know that she was notified of both.April Zimmerman Rumple, CMA

## 2019-01-22 NOTE — Telephone Encounter (Signed)
-----   Message from Cleophas Dunker, DO sent at 01/21/2019  3:08 PM EDT ----- Please call patient and inform that HIV and RPR are negative.  Gonorrhea and chlamydia are still pending.

## 2019-02-12 ENCOUNTER — Other Ambulatory Visit: Payer: Self-pay | Admitting: *Deleted

## 2019-02-12 DIAGNOSIS — H01001 Unspecified blepharitis right upper eyelid: Secondary | ICD-10-CM

## 2019-02-12 MED ORDER — DOUBLE ANTIBIOTIC 500-10000 UNIT/GM EX OINT: 1.0000 "application " | TOPICAL_OINTMENT | Freq: Two times a day (BID) | CUTANEOUS | 3 refills | Status: DC

## 2019-02-24 ENCOUNTER — Other Ambulatory Visit: Payer: Self-pay

## 2019-02-24 DIAGNOSIS — Z20822 Contact with and (suspected) exposure to covid-19: Secondary | ICD-10-CM

## 2019-02-25 LAB — NOVEL CORONAVIRUS, NAA: SARS-CoV-2, NAA: NOT DETECTED

## 2019-04-01 ENCOUNTER — Other Ambulatory Visit: Payer: Self-pay | Admitting: Family Medicine

## 2019-04-01 DIAGNOSIS — R921 Mammographic calcification found on diagnostic imaging of breast: Secondary | ICD-10-CM

## 2019-04-05 ENCOUNTER — Ambulatory Visit
Admission: RE | Admit: 2019-04-05 | Discharge: 2019-04-05 | Disposition: A | Discharge status: Home / Self Care | Source: Ambulatory Visit | Attending: Family Medicine | Admitting: Family Medicine

## 2019-04-05 ENCOUNTER — Other Ambulatory Visit

## 2019-04-05 ENCOUNTER — Other Ambulatory Visit: Payer: Self-pay

## 2019-04-05 DIAGNOSIS — R921 Mammographic calcification found on diagnostic imaging of breast: Secondary | ICD-10-CM

## 2019-04-19 ENCOUNTER — Telehealth: Payer: Self-pay

## 2019-04-20 MED ORDER — KETOTIFEN FUMARATE 0.025 % OP SOLN: 1.0000 [drp] | Freq: Two times a day (BID) | OPHTHALMIC | 3 refills | Status: DC

## 2019-04-21 MED ORDER — KETOTIFEN FUMARATE 0.025 % OP SOLN: 1.0000 [drp] | Freq: Two times a day (BID) | OPHTHALMIC | 3 refills | Status: DC

## 2019-04-21 NOTE — Telephone Encounter (Signed)
Rx failed.   Resent. Christen Bame, CMA

## 2019-04-21 NOTE — Addendum Note (Signed)
Addended by: Christen Bame D on: 04/21/2019 12:55 PM   Modules accepted: Orders

## 2019-04-22 MED ORDER — KETOTIFEN FUMARATE 0.025 % OP SOLN: 1.0000 [drp] | Freq: Two times a day (BID) | OPHTHALMIC | 3 refills | Status: DC

## 2019-04-22 NOTE — Telephone Encounter (Signed)
Rx failed.   Resent. Kiing Deakin, CMA  

## 2019-04-22 NOTE — Addendum Note (Signed)
Addended by: Christen Bame D on: 04/22/2019 12:09 PM   Modules accepted: Orders

## 2019-05-04 NOTE — Telephone Encounter (Signed)
Patient calls nurse line stating Ketotifen was never received by pharmacy. I called meds by mail to clarify, they did not received faxed rx. They would not allow me to give a verbal, refaxed rx to meds by mail.

## 2019-05-12 ENCOUNTER — Other Ambulatory Visit: Payer: Self-pay | Admitting: Family Medicine

## 2019-05-12 DIAGNOSIS — H01001 Unspecified blepharitis right upper eyelid: Secondary | ICD-10-CM

## 2019-05-12 MED ORDER — OLOPATADINE HCL 0.1 % OP SOLN: 1.0000 [drp] | Freq: Two times a day (BID) | OPHTHALMIC | 6 refills | Status: DC | PRN

## 2019-05-12 NOTE — Progress Notes (Signed)
Sent as zaditor not at pharmacy

## 2019-05-12 NOTE — Telephone Encounter (Signed)
[facility]anol sent to [facility]

## 2019-05-12 NOTE — Telephone Encounter (Signed)
Pt states that pharmacy does not have ketotifen, they do have patanol in stock if PCP is willing to change it.  Christen Bame, CMA

## 2019-10-29 ENCOUNTER — Other Ambulatory Visit: Payer: Self-pay

## 2019-10-29 DIAGNOSIS — H01004 Unspecified blepharitis left upper eyelid: Secondary | ICD-10-CM

## 2019-10-29 MED ORDER — MONTELUKAST SODIUM 10 MG PO TABS: 10.0000 mg | ORAL_TABLET | Freq: Every day | ORAL | 0 refills | Status: DC

## 2019-10-29 MED ORDER — ALBUTEROL SULFATE HFA 108 (90 BASE) MCG/ACT IN AERS: 2.0000 | INHALATION_SPRAY | RESPIRATORY_TRACT | 3 refills | Status: DC | PRN

## 2019-10-29 MED ORDER — FLUTICASONE PROPIONATE 50 MCG/ACT NA SUSP: 2.0000 | Freq: Every day | NASAL | 0 refills | Status: DC

## 2019-10-29 MED ORDER — VALSARTAN-HYDROCHLOROTHIAZIDE 80-12.5 MG PO TABS: 1.0000 | ORAL_TABLET | Freq: Every day | ORAL | 0 refills | Status: DC

## 2019-10-29 NOTE — Telephone Encounter (Signed)
Needs an appointment.

## 2019-11-22 ENCOUNTER — Ambulatory Visit (INDEPENDENT_AMBULATORY_CARE_PROVIDER_SITE_OTHER): Admitting: Family Medicine

## 2019-11-22 ENCOUNTER — Encounter: Payer: Self-pay | Admitting: Family Medicine

## 2019-11-22 ENCOUNTER — Other Ambulatory Visit: Payer: Self-pay

## 2019-11-22 ENCOUNTER — Other Ambulatory Visit (HOSPITAL_COMMUNITY)
Admission: RE | Admit: 2019-11-22 | Discharge: 2019-11-22 | Disposition: A | Discharge status: Home / Self Care | Source: Ambulatory Visit | Attending: Family Medicine | Admitting: Family Medicine

## 2019-11-22 VITALS — BP 126/76 | HR 86 | Ht 61.0 in | Wt 162.0 lb

## 2019-11-22 DIAGNOSIS — Z113 Encounter for screening for infections with a predominantly sexual mode of transmission: Secondary | ICD-10-CM

## 2019-11-22 DIAGNOSIS — Z0000 Encounter for general adult medical examination without abnormal findings: Secondary | ICD-10-CM

## 2019-11-22 DIAGNOSIS — B9689 Other specified bacterial agents as the cause of diseases classified elsewhere: Secondary | ICD-10-CM | POA: Insufficient documentation

## 2019-11-22 DIAGNOSIS — Z124 Encounter for screening for malignant neoplasm of cervix: Secondary | ICD-10-CM | POA: Insufficient documentation

## 2019-11-22 DIAGNOSIS — N760 Acute vaginitis: Secondary | ICD-10-CM

## 2019-11-22 DIAGNOSIS — K047 Periapical abscess without sinus: Secondary | ICD-10-CM

## 2019-11-22 DIAGNOSIS — H01001 Unspecified blepharitis right upper eyelid: Secondary | ICD-10-CM

## 2019-11-22 DIAGNOSIS — H01004 Unspecified blepharitis left upper eyelid: Secondary | ICD-10-CM

## 2019-11-22 DIAGNOSIS — K029 Dental caries, unspecified: Secondary | ICD-10-CM

## 2019-11-22 DIAGNOSIS — I10 Essential (primary) hypertension: Secondary | ICD-10-CM

## 2019-11-22 DIAGNOSIS — J309 Allergic rhinitis, unspecified: Secondary | ICD-10-CM

## 2019-11-22 LAB — POCT WET PREP (WET MOUNT)
Clue Cells Wet Prep Whiff POC: POSITIVE
Trichomonas Wet Prep HPF POC: ABSENT

## 2019-11-22 MED ORDER — DOUBLE ANTIBIOTIC 500-10000 UNIT/GM EX OINT: 1.0000 "application " | TOPICAL_OINTMENT | Freq: Two times a day (BID) | CUTANEOUS | 3 refills | Status: DC

## 2019-11-22 MED ORDER — PENICILLIN V POTASSIUM 500 MG PO TABS: 500.0000 mg | ORAL_TABLET | Freq: Two times a day (BID) | ORAL | 0 refills | Status: DC

## 2019-11-22 MED ORDER — FLUTICASONE PROPIONATE 50 MCG/ACT NA SUSP: 2.0000 | Freq: Every day | NASAL | 3 refills | Status: DC

## 2019-11-22 MED ORDER — MONTELUKAST SODIUM 10 MG PO TABS: 10.0000 mg | ORAL_TABLET | Freq: Every day | ORAL | 3 refills | Status: DC

## 2019-11-22 MED ORDER — ALBUTEROL SULFATE HFA 108 (90 BASE) MCG/ACT IN AERS: 2.0000 | INHALATION_SPRAY | RESPIRATORY_TRACT | 3 refills | Status: DC | PRN

## 2019-11-22 MED ORDER — METRONIDAZOLE 500 MG PO TABS: 500.0000 mg | ORAL_TABLET | Freq: Two times a day (BID) | ORAL | 0 refills | Status: DC

## 2019-11-22 MED ORDER — OLOPATADINE HCL 0.1 % OP SOLN: 1.0000 [drp] | Freq: Two times a day (BID) | OPHTHALMIC | 6 refills | Status: DC | PRN

## 2019-11-22 NOTE — Progress Notes (Signed)
SUBJECTIVE:   CHIEF COMPLAINT / HPI:   Presents for a physical:  HTN Patient currently on Valsartan/hydrochlorothiazide Denies chest pain, shortness of breath, leg edema Reports compliance with her medication Last BMP in 2019 Doesn't check BP  Allergic rhinitis Currently on Singulair, Flonase, albuterol as needed  Pap Smear Patient also presents today for a Pap smear, last Pap smear in 2017 with ASCUS.  She had a colposcopy that was negative.  Was supposed to come back for repeat Pap in 2018, did not.  Wants to be tested for STDs with Pap smear.  Wants to be tested for HIV and RPR  Screening for colon cancer Has never had a colonoscopy Had negative FIT test 01/2019 and wants to continue with this  Dental Infection Has been trying to find somewhere that will get her infection taken care of Has been working on this for 2 years She was told it will be $600 out-of-pocket Teeth are broken in the gums Having severe pain again and is requesting antibiotic No fevers  Blepharitis Has a history of blepharitis, has occasional itching She is requesting a refill as she freqeuntly has redness and itching of her eyelids and occasional styes that only improve with the medication Uses the medication sparingly  History of breast mass Last Mammogram 03/2019, recommended diagnostic mammogram repeat in 1 year  Reports that she had J&J COVID vaccine.  PERTINENT  PMH / PSH: HTN, allergic rhinitis, chronic dental infection  OBJECTIVE:   BP 126/76   Pulse 86   Ht 5\' 1"  (1.549 m)   Wt 162 lb (73.5 kg)   LMP 07/19/2011   SpO2 98%   BMI 30.61 kg/m    Physical Exam:  General: 64 y.o. female in NAD Cardio: RRR no m/r/g Lungs: CTAB, no wheezing, no rhonchi, no crackles, no IWOB on RA Skin: warm and dry Extremities: No edema GU: Pelvic exam performed with patient supine.  Chaperone in room.  Bilateral labia without abnormalities, no inguinal LAD palpated.  Cervix exhibits no  discharge, no cervix abnormalities.  No vaginal lesions.  Vaginal discharge white.  Results for orders placed or performed in visit on 11/22/19 (from the past 24 hour(s))  POCT Wet Prep [facility] [place])     Status: Abnormal   Collection Time: 11/22/19 10:44 AM  Result Value Ref Range   Source Wet Prep POC VAG    WBC, Wet Prep HPF POC 0-3    Bacteria Wet Prep HPF POC Moderate (A) Few   Clue Cells Wet Prep HPF POC Moderate (A) None   Clue Cells Wet Prep Whiff POC Positive Whiff    Yeast Wet Prep HPF POC None None   KOH Wet Prep POC None None   Trichomonas Wet Prep HPF POC Absent Absent    ASSESSMENT/PLAN:   Encounter for annual physical exam Patient declines colonoscopy.  Plan to repeat FIT testing in August when she is due.  Was negative in August 2020.  Up-to-date on mammogram, next needed October 2021 given history of breast mass.  Health maintenance otherwise up-to-date.  HYPERTENSION, BENIGN ESSENTIAL BP well controlled today.  Continue valsartan/hydrochlorothiazide.  Will obtain BMP today.  Allergic rhinitis Patient has been doing well with current regimen of Singulair, Flonase, albuterol as needed.  We will continue with these medications.  Refill sent.  Screening for cervical cancer Last Pap in 2017 with ASCUS and negative colpo.  Pap performed today.  Gonorrhea/chlamydia added onto Pap.  Screen for sexually transmitted diseases Gonorrhea/chlamydia added  onto Pap.  No trichomonas on wet prep.  HIV and RPR obtained.  Bacterial vaginosis Work-up consistent with bacterial vaginosis.  Will treat with metronidazole 500 mg twice daily x7 days.  Advised to avoid alcohol while taking this medication.  Infected dental carries Patient does have multiple dental caries that require her to be seen by an oral surgeon.  She continues to have difficulty affording this.  She is aware that ultimately she needs surgery.  Given her pain, will refill for 14 days.  Also given information on  medication assistance programs and community resources to see if anything could help her afford her dental surgery.  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  She is using this sparingly.  We will send refills.  Advised to not use, she has symptoms.     Unknown Jim, DO [facility]

## 2019-11-22 NOTE — Assessment & Plan Note (Signed)
Patient does have multiple dental caries that require her to be seen by an oral surgeon.  She continues to have difficulty affording this.  She is aware that ultimately she needs surgery.  Given her pain, will refill for 14 days.  Also given information on medication assistance programs and community resources to see if anything could help her afford her dental surgery.

## 2019-11-22 NOTE — Patient Instructions (Addendum)
Thank you for coming to see me today. It was a pleasure. Today we talked about:   It was great to see you.    I have sent your medications to the pharmacy and will send more of your blood pressure medication when it is needed.  We will get blood work today and call you if something is abnormal, otherwise we will send a letter.  Your pap smear and STD testing will take a few days to come back.  We will call if abnormal, but will send a letter if everything is okay.  You will be due for stool testing in August.  You will be due for a mammogram in October.  You have bacterial vaginosis so I have sent Flagyl to the pharmacy.  Take this for 7 days twice a day.  Do not drink alcohol while taking this medication.  Please follow-up with me in 6 months or sooner as needed.  If you have any questions or concerns, please do not hesitate to call the office at [phone].  Best,   Luis Abed, DO

## 2019-11-22 NOTE — Assessment & Plan Note (Signed)
Last Pap in 2017 with ASCUS and negative colpo.  Pap performed today.  Gonorrhea/chlamydia added onto Pap.

## 2019-11-22 NOTE — Assessment & Plan Note (Signed)
Patient declines colonoscopy.  Plan to repeat FIT testing in August when she is due.  Was negative in August 2020.  Up-to-date on mammogram, next needed October 2021 given history of breast mass.  Health maintenance otherwise up-to-date.

## 2019-11-22 NOTE — Assessment & Plan Note (Signed)
Patient has a history of blepharitis that happens frequently and improves with using Pataday and Polysporin ointment.  She is using this sparingly.  We will send refills.  Advised to not use, she has symptoms.

## 2019-11-22 NOTE — Assessment & Plan Note (Signed)
Gonorrhea/chlamydia added onto Pap.  No trichomonas on wet prep.  HIV and RPR obtained.

## 2019-11-22 NOTE — Assessment & Plan Note (Signed)
Patient has been doing well with current regimen of Singulair, Flonase, albuterol as needed.  We will continue with these medications.  Refill sent.

## 2019-11-22 NOTE — Assessment & Plan Note (Signed)
BP well controlled today.  Continue valsartan/hydrochlorothiazide.  Will obtain BMP today.

## 2019-11-22 NOTE — Assessment & Plan Note (Signed)
Work-up consistent with bacterial vaginosis.  Will treat with metronidazole 500 mg twice daily x7 days.  Advised to avoid alcohol while taking this medication.

## 2019-11-23 ENCOUNTER — Encounter: Payer: Self-pay | Admitting: Family Medicine

## 2019-11-23 LAB — BASIC METABOLIC PANEL
BUN/Creatinine Ratio: 11 — ABNORMAL LOW (ref 12–28)
BUN: 9 mg/dL (ref 8–27)
CO2: 22 mmol/L (ref 20–29)
Calcium: 10.1 mg/dL (ref 8.7–10.3)
Chloride: 99 mmol/L (ref 96–106)
Creatinine, Ser: 0.84 mg/dL (ref 0.57–1.00)
GFR calc Af Amer: 86 mL/min/{1.73_m2} (ref >59)
GFR calc non Af Amer: 75 mL/min/{1.73_m2} (ref >59)
Glucose: 190 mg/dL — ABNORMAL HIGH (ref 65–99)
Potassium: 3.9 mmol/L (ref 3.5–5.2)
Sodium: 137 mmol/L (ref 134–144)

## 2019-11-23 LAB — HIV ANTIBODY (ROUTINE TESTING W REFLEX): HIV Screen 4th Generation wRfx: NONREACTIVE

## 2019-11-23 LAB — RPR: RPR Ser Ql: NONREACTIVE

## 2019-11-23 NOTE — Progress Notes (Signed)
Letter sent.

## 2019-11-24 LAB — CYTOLOGY - PAP
Chlamydia: NEGATIVE
Comment: NEGATIVE
Comment: NEGATIVE
Comment: NORMAL
High risk HPV: NEGATIVE
Neisseria Gonorrhea: NEGATIVE

## 2020-01-24 ENCOUNTER — Other Ambulatory Visit: Payer: Self-pay

## 2020-01-24 ENCOUNTER — Ambulatory Visit (INDEPENDENT_AMBULATORY_CARE_PROVIDER_SITE_OTHER)

## 2020-01-24 DIAGNOSIS — Z111 Encounter for screening for respiratory tuberculosis: Secondary | ICD-10-CM | POA: Diagnosis not present

## 2020-01-24 NOTE — Progress Notes (Signed)
Patient is here for a PPD placement.  It was placed on 01/24/2020 in the left forearm @ 1045 am.    Scheduled nurse visit for PPD read on 8/11.    Veronda Prude, RN

## 2020-01-26 ENCOUNTER — Other Ambulatory Visit: Payer: Self-pay

## 2020-01-26 ENCOUNTER — Ambulatory Visit (INDEPENDENT_AMBULATORY_CARE_PROVIDER_SITE_OTHER)

## 2020-01-26 DIAGNOSIS — Z111 Encounter for screening for respiratory tuberculosis: Secondary | ICD-10-CM

## 2020-01-26 LAB — TB SKIN TEST
Induration: 0 mm
TB Skin Test: NEGATIVE

## 2020-01-26 NOTE — Progress Notes (Signed)
Patient is here for a PPD read.  It was placed on 01/24/2020 in the left forearm @ 1045 am.    PPD RESULTS:  Result: negative Induration: 0 mm  Letter created and given to patient for documentation purposes. Veronda Prude, RN

## 2020-02-08 NOTE — Progress Notes (Signed)
    SUBJECTIVE:   CHIEF COMPLAINT / HPI:   Menopause Questions Had some vaginal spotting about a month ago for two weeks She has not had period in many years No vaginal bleeding now Has hot flashes frequently  She has tried some herbal supplements and had good relief, thinks she can handle the hot flashes okay Does note some vaginal dryness Last Pap 11/22/2019 with LSIL with neg HPV, plan to repeat in 1 year  PERTINENT  PMH / PSH: HTN, Allergic Rhinitis  OBJECTIVE:   BP 130/84   Pulse 78   Ht 5\' 1"  (1.549 m)   Wt 161 lb (73 kg)   LMP 07/19/2011   SpO2 98%   BMI 30.42 kg/m    Physical Exam:  General: 64 y.o. female in NAD Lungs: Breathing comfortably on RA Skin: warm and dry Extremities: No edema GU: Pelvic exam performed with patient supine.  Chaperone in room.  Bilateral labia without abnormalities, no inguinal LAD palpated.  Cervix exhibits thick yellow/brown discharge, no cervix abnormalities.  No vaginal lesions.  Vaginal discharge scant.  Results for orders placed or performed in visit on 02/09/20 (from the past 24 hour(s))  POCT Wet Prep 02/11/20 [place])     Status: None   Collection Time: 02/09/20  1:55 PM  Result Value Ref Range   Source Wet Prep POC VAG    WBC, Wet Prep HPF POC >20    Bacteria Wet Prep HPF POC Few Few   Clue Cells Wet Prep HPF POC None None   Clue Cells Wet Prep Whiff POC Negative Whiff    Yeast Wet Prep HPF POC None None   KOH Wet Prep POC None None   Trichomonas Wet Prep HPF POC Absent Absent    ASSESSMENT/PLAN:   Abnormal uterine bleeding Vaginal exam performed without clear cause of bleeding.  Did have yellow/brown discharge on exam.  Wet prep negative.  G/C pending.  Has LSIL on pap 11/2019 with neg HPV, planning to repeat in 1 year.  Given that she is post-menopausal, will proceed with TVUS and EMB.  EMB will be performed in this provider's clinic as patient can't come to clinic on Thursdays due to work schedule.  Screen for sexually  transmitted diseases Wet prep negative.  G/C pending and HIV and RPR also ordered at patient request.  Menopause Discussed that treatment options are available for hot flashes.  Pt declines.  Patient she, will start with irrigation, will consider Estrace cream in the future if needed.  Also discussed other symptoms which include hirsutism.  Discussed with patient that if she has bleeding advised to let physician know right away.     11-17-1977, DO [facility]

## 2020-02-09 ENCOUNTER — Other Ambulatory Visit: Payer: Self-pay

## 2020-02-09 ENCOUNTER — Other Ambulatory Visit (HOSPITAL_COMMUNITY)
Admission: RE | Admit: 2020-02-09 | Discharge: 2020-02-09 | Disposition: A | Discharge status: Home / Self Care | Source: Ambulatory Visit | Attending: Family Medicine | Admitting: Family Medicine

## 2020-02-09 ENCOUNTER — Encounter: Payer: Self-pay | Admitting: Family Medicine

## 2020-02-09 ENCOUNTER — Ambulatory Visit (INDEPENDENT_AMBULATORY_CARE_PROVIDER_SITE_OTHER): Admitting: Family Medicine

## 2020-02-09 VITALS — BP 130/84 | HR 78 | Ht 61.0 in | Wt 161.0 lb

## 2020-02-09 DIAGNOSIS — Z780 Asymptomatic menopausal state: Secondary | ICD-10-CM | POA: Diagnosis not present

## 2020-02-09 DIAGNOSIS — Z113 Encounter for screening for infections with a predominantly sexual mode of transmission: Secondary | ICD-10-CM | POA: Diagnosis present

## 2020-02-09 DIAGNOSIS — N939 Abnormal uterine and vaginal bleeding, unspecified: Secondary | ICD-10-CM | POA: Insufficient documentation

## 2020-02-09 LAB — POCT WET PREP (WET MOUNT)
Clue Cells Wet Prep Whiff POC: NEGATIVE
Trichomonas Wet Prep HPF POC: ABSENT
WBC, Wet Prep HPF POC: >20

## 2020-02-09 NOTE — Patient Instructions (Addendum)
Thank you for coming to see me today. It was a pleasure. Today we talked about:   Your vaginal bleeding.  We have scheduled you for an ultrasound.  You will also need to come back for an endometrial biopsy.  Your wet prep looks good.  Your gonorrhea/chalmydia test, HIV and syphlic tests will come back in a few days.  We will call you with these results.  You can use lubricant for your vaginal dryness and if you would like to try estrogen cream, please let me know.  Please follow-up with me for your endometrial biopsy.  If you have any questions or concerns, please do not hesitate to call the office at [phone].  Best,   Luis Abed, DO

## 2020-02-09 NOTE — Assessment & Plan Note (Addendum)
Wet prep negative.  G/C pending and HIV and RPR also ordered at patient request.

## 2020-02-09 NOTE — Assessment & Plan Note (Addendum)
Vaginal exam performed without clear cause of bleeding.  Did have yellow/brown discharge on exam.  Wet prep negative.  G/C pending.  Has LSIL on pap 11/2019 with neg HPV, planning to repeat in 1 year.  Given that she is post-menopausal, will proceed with TVUS and EMB.  EMB will be performed in this provider's clinic as patient can't come to clinic on Thursdays due to work schedule.

## 2020-02-09 NOTE — Assessment & Plan Note (Signed)
Discussed that treatment options are available for hot flashes.  Pt declines.  Patient she, will start with irrigation, will consider Estrace cream in the future if needed.  Also discussed other symptoms which include hirsutism.  Discussed with patient that if she has bleeding advised to let physician know right away.

## 2020-02-10 ENCOUNTER — Other Ambulatory Visit: Payer: Self-pay | Admitting: Family Medicine

## 2020-02-10 DIAGNOSIS — H01004 Unspecified blepharitis left upper eyelid: Secondary | ICD-10-CM

## 2020-02-10 DIAGNOSIS — J309 Allergic rhinitis, unspecified: Secondary | ICD-10-CM

## 2020-02-10 LAB — RPR: RPR Ser Ql: NONREACTIVE

## 2020-02-10 LAB — CERVICOVAGINAL ANCILLARY ONLY
Chlamydia: NEGATIVE
Comment: NEGATIVE
Comment: NORMAL
Neisseria Gonorrhea: NEGATIVE

## 2020-02-10 LAB — HIV ANTIBODY (ROUTINE TESTING W REFLEX): HIV Screen 4th Generation wRfx: NONREACTIVE

## 2020-02-10 MED ORDER — OLOPATADINE HCL 0.1 % OP SOLN: 1.0000 [drp] | Freq: Two times a day (BID) | OPHTHALMIC | 6 refills | Status: DC | PRN

## 2020-02-10 MED ORDER — PROPOFOL 10 MG/ML IV BOLUS
INTRAVENOUS | Status: AC
  Filled 2020-02-10: qty 20

## 2020-02-10 MED ORDER — LIDOCAINE 2% (20 MG/ML) 5 ML SYRINGE
INTRAMUSCULAR | Status: AC
  Filled 2020-02-10: qty 5

## 2020-02-10 MED ORDER — VALSARTAN-HYDROCHLOROTHIAZIDE 80-12.5 MG PO TABS
1.0000 | ORAL_TABLET | Freq: Every day | ORAL | 1 refills | Status: DC
Start: 2020-02-10 — End: 2020-09-15

## 2020-02-10 MED ORDER — ROCURONIUM BROMIDE 10 MG/ML (PF) SYRINGE
PREFILLED_SYRINGE | INTRAVENOUS | Status: AC
  Filled 2020-02-10: qty 10

## 2020-02-10 MED ORDER — MONTELUKAST SODIUM 10 MG PO TABS: 10.0000 mg | ORAL_TABLET | Freq: Every day | ORAL | 3 refills | Status: DC

## 2020-02-10 MED ORDER — MIDAZOLAM HCL 2 MG/2ML IJ SOLN
INTRAMUSCULAR | Status: AC
  Filled 2020-02-10: qty 2

## 2020-02-10 MED ORDER — FLUTICASONE PROPIONATE 50 MCG/ACT NA SUSP: 2.0000 | Freq: Every day | NASAL | 3 refills | Status: DC

## 2020-02-10 MED ORDER — FENTANYL CITRATE (PF) 250 MCG/5ML IJ SOLN
INTRAMUSCULAR | Status: AC
  Filled 2020-02-10: qty 5

## 2020-02-10 NOTE — Progress Notes (Signed)
Patient requesting refills for medications.

## 2020-02-14 ENCOUNTER — Other Ambulatory Visit: Payer: Self-pay

## 2020-02-14 ENCOUNTER — Ambulatory Visit (INDEPENDENT_AMBULATORY_CARE_PROVIDER_SITE_OTHER): Admitting: Family Medicine

## 2020-02-14 VITALS — BP 144/82 | HR 92 | Wt 159.8 lb

## 2020-02-14 DIAGNOSIS — N939 Abnormal uterine and vaginal bleeding, unspecified: Secondary | ICD-10-CM

## 2020-02-14 MED ORDER — ACETAMINOPHEN 500 MG PO TABS
1000.0000 mg | ORAL_TABLET | Freq: Once | ORAL | Status: AC
  Administered 2020-02-14: 1000 mg via ORAL

## 2020-02-14 NOTE — Progress Notes (Addendum)
    SUBJECTIVE:   CHIEF COMPLAINT / HPI:   Endometrial biopsy Patient presents today for endometrial biopsy due to postmenopausal bleeding See office note from 8/25 She is scheduled for an ultrasound 9/13  PERTINENT  PMH / PSH: Hypertension, allergic rhinitis, menopause  OBJECTIVE:   BP (!) 144/82   Pulse 92   Wt 159 lb 12.8 oz (72.5 kg)   LMP 07/19/2011   SpO2 97%   BMI 30.19 kg/m    ENDOMETRIAL BIOPSY     The indications for endometrial biopsy were reviewed.   Risks of the biopsy including cramping, bleeding, infection, uterine perforation, inadequate specimen and need for additional procedures  were discussed. The patient states she understands and agrees to undergo procedure today. Consent was signed. Time out was performed. Urine HCG was negative. During the pelvic exam, the cervix was prepped with Betadine. A single-toothed tenaculum was placed on the anterior lip of the cervix to stabilize it. The 3 mm pipelle was introduced into the endometrial cavity without difficulty to a depth of 8cm, and a moderate amount of tissue was obtained and sent to pathology. The instruments were removed from the patient's vagina. Minimal bleeding from the cervix was noted. The patient tolerated the procedure well. Routine post-procedure instructions were given to the patient.    Performed under the supervision of Dr. Lum Babe   ASSESSMENT/PLAN:   Abnormal uterine bleeding See procedure note per above.  Pt tolerated procedure well.  Given 1000 mg of Tylenol before leaving for cramping.  Discussed reasons to return to care including bleeding past 2 days, worsening of pelvic pain, fever chills.  She voiced understanding.  Advised to not have intercourse for the next few days, she states that she is not currently sexually active.  She will return on 9/13 for her transvaginal ultrasound and will make an appointment after this to discuss results.     Unknown Jim, DO [facility]

## 2020-02-14 NOTE — Patient Instructions (Signed)
Thank you for coming to see me today. It was a pleasure. Today we talked about:   We did an endometrial biopsy today.  You can tylenol or ibuprofen for cramping.  You may have spotting for a few days.  Make sure you go for your ultrasound as scheduled.  Please follow-up with me in 1 month after your ultrasound.  If you have any questions or concerns, please do not hesitate to call the office at [phone].  Best,   Luis Abed, DO   Endometrial Biopsy, Care After This sheet gives you information about how to care for yourself after your procedure. Your health care provider may also give you more specific instructions. If you have problems or questions, contact your health care provider. What can I expect after the procedure? After the procedure, it is common to have:  Mild cramping.  A small amount of vaginal bleeding for a few days. This is normal. Follow these instructions at home:   Take over-the-counter and prescription medicines only as told by your health care provider.  Do not douche, use tampons, or have sexual intercourse until your health care provider approves.  Return to your normal activities as told by your health care provider. Ask your health care provider what activities are safe for you.  Follow instructions from your health care provider about any activity restrictions, such as restrictions on strenuous exercise or heavy lifting. Contact a health care provider if:  You have heavy bleeding, or bleed for longer than 2 days after the procedure.  You have bad smelling discharge from your vagina.  You have a fever or chills.  You have a burning sensation when urinating or you have difficulty urinating.  You have severe pain in your lower abdomen. Get help right away if:  You have severe cramps in your stomach or back.  You pass large blood clots.  Your bleeding increases.  You become weak or light-headed, or you pass out. Summary  After  the procedure, it is common to have mild cramping and a small amount of vaginal bleeding for a few days.  Do not douche, use tampons, or have sexual intercourse until your health care provider approves.  Return to your normal activities as told by your health care provider. Ask your health care provider what activities are safe for you. This information is not intended to replace advice given to you by your health care provider. Make sure you discuss any questions you have with your health care provider. Document Revised: 05/16/2017 Document Reviewed: 06/19/2016 Elsevier Patient Education  2020 [employer].

## 2020-02-14 NOTE — Assessment & Plan Note (Signed)
See procedure note per above.  Pt tolerated procedure well.  Given 1000 mg of Tylenol before leaving for cramping.  Discussed reasons to return to care including bleeding past 2 days, worsening of pelvic pain, fever chills.  She voiced understanding.  Advised to not have intercourse for the next few days, she states that she is not currently sexually active.  She will return on 9/13 for her transvaginal ultrasound and will make an appointment after this to discuss results.

## 2020-02-14 NOTE — Addendum Note (Signed)
Addended by: Unknown Jim on: 02/14/2020 02:43 PM   Modules accepted: Orders

## 2020-02-22 ENCOUNTER — Other Ambulatory Visit: Payer: Self-pay

## 2020-02-22 DIAGNOSIS — K047 Periapical abscess without sinus: Secondary | ICD-10-CM

## 2020-02-22 MED ORDER — PENICILLIN V POTASSIUM 500 MG PO TABS: 500.0000 mg | ORAL_TABLET | Freq: Two times a day (BID) | ORAL | 0 refills | Status: DC

## 2020-02-22 NOTE — Telephone Encounter (Signed)
Patient calls nurse line requesting a refill on Penicillin. Patient reports she "usually" gets this from PCP for her oral problems. Patient reports she can not get in to see a Dentist for surgery until December. Please advise.

## 2020-02-28 ENCOUNTER — Ambulatory Visit (HOSPITAL_COMMUNITY): Admission: RE | Admit: 2020-02-28 | Source: Ambulatory Visit

## 2020-03-09 MED ORDER — PENICILLIN V POTASSIUM 500 MG PO TABS: 500.0000 mg | ORAL_TABLET | Freq: Two times a day (BID) | ORAL | 0 refills | Status: DC

## 2020-03-09 NOTE — Telephone Encounter (Signed)
Received verbal orders per Dr. Obie Dredge to send in rx for antibiotic.   Veronda Prude, RN

## 2020-03-09 NOTE — Addendum Note (Signed)
Addended by: Veronda Prude on: 03/09/2020 01:42 PM   Modules accepted: Orders

## 2020-03-09 NOTE — Telephone Encounter (Signed)
Patient calls nurse line regarding rx for penicillin v potassium 500 mg tablets. Patient was unable to receive by mail pharmacy due to this being an acute issue. Please advise [facility] [address].   Unsure if therapy was still appropriate, due to time since initial rx being sent in.   Patient also states that she was out of town and missed her scheduled [place]. Will attempt to call scheduling department to reschedule.   Veronda Prude, RN

## 2020-03-09 NOTE — Addendum Note (Signed)
Addended by: Veronda Prude on: 03/09/2020 12:33 PM   Modules accepted: Orders

## 2020-04-13 IMAGING — MG DIGITAL DIAGNOSTIC UNILATERAL LEFT MAMMOGRAM WITH TOMO AND CAD
7 series · 8 of 15 positions shown · non-contrast
Comparison: Recent screening mammogram dated 03/24/2018.

CLINICAL DATA: Patient returns today to evaluate possible LEFT
breast masses and LEFT breast calcifications identified on recent
screening mammogram.

EXAM:
DIGITAL DIAGNOSTIC LEFT MAMMOGRAM WITH CAD AND TOMO
ULTRASOUND LEFT BREAST

[L ML (1 of 2)]
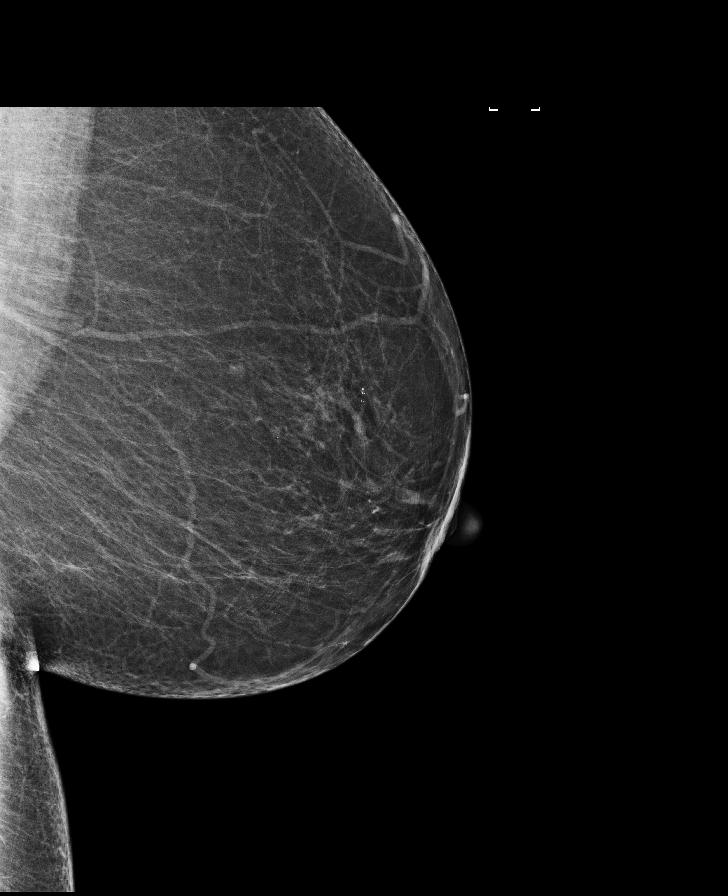

[L CC]
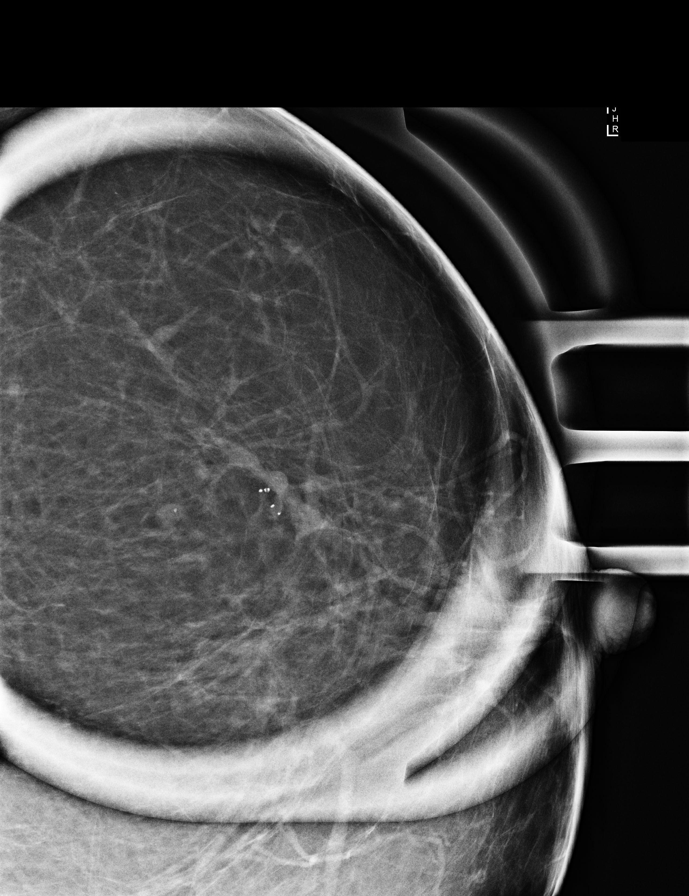

[L ML (2 of 2)]
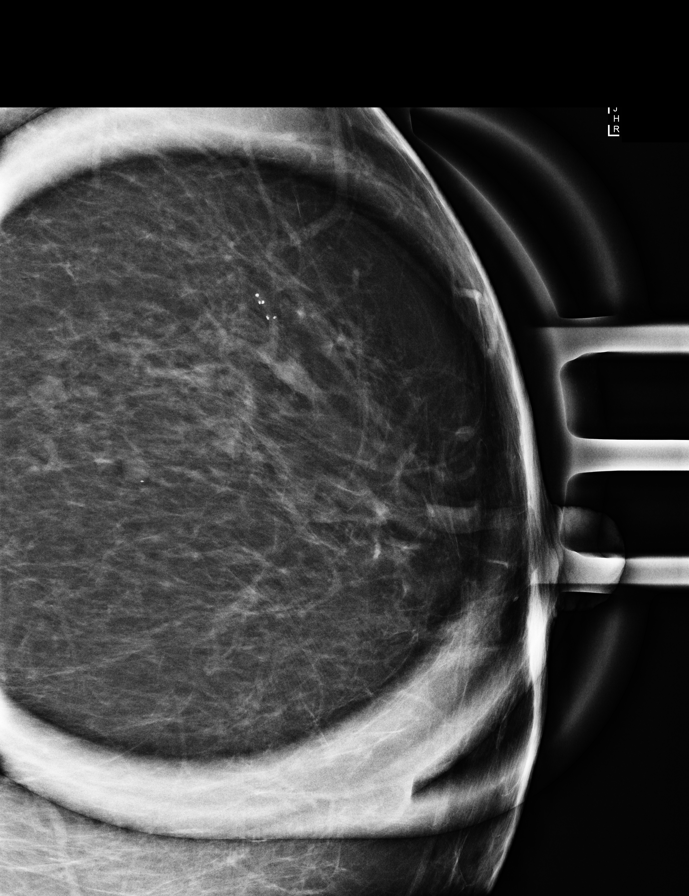

[L CC synth-2D]
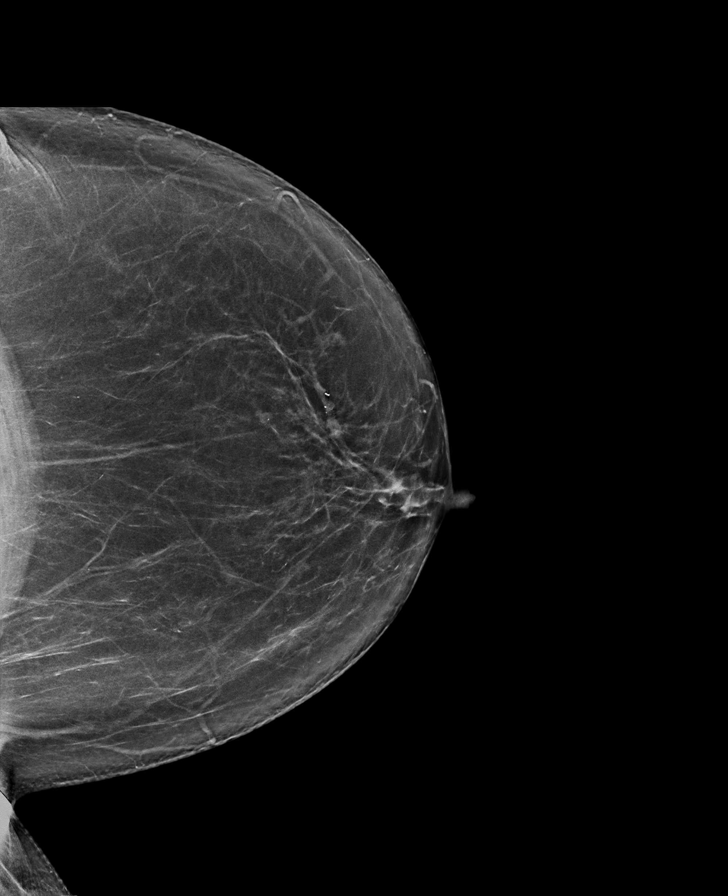

[L MLO synth-2D]
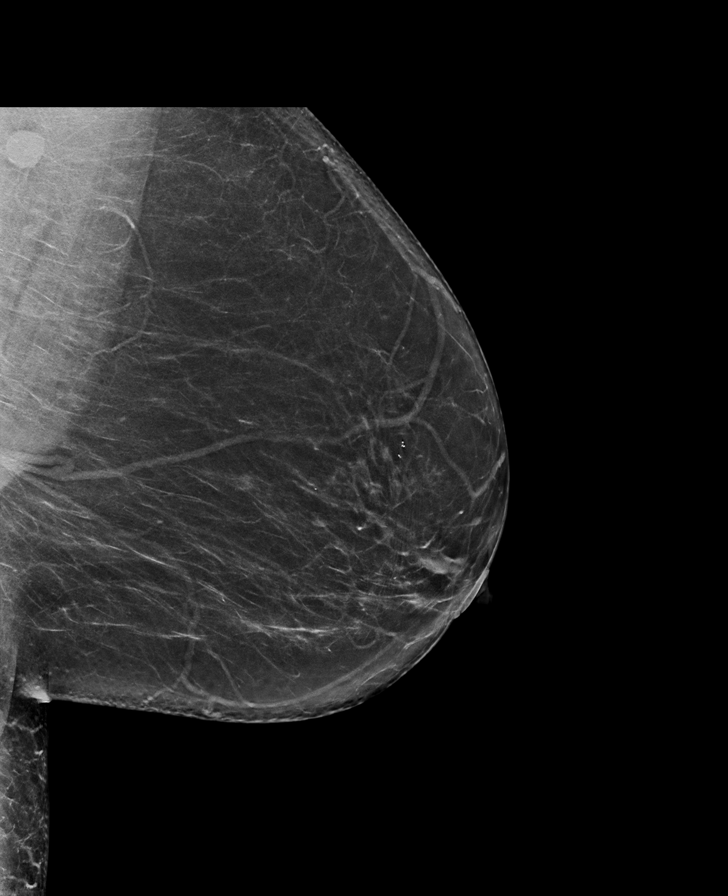

[L CC tomo · 2 of 78 frames shown]
[frame 26/78]
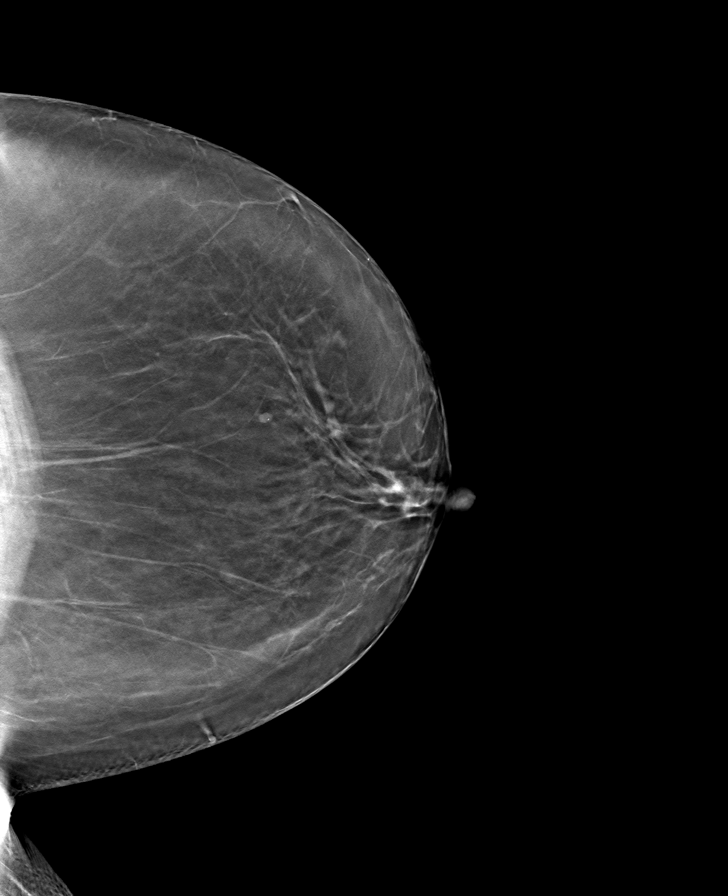
[frame 39/78]
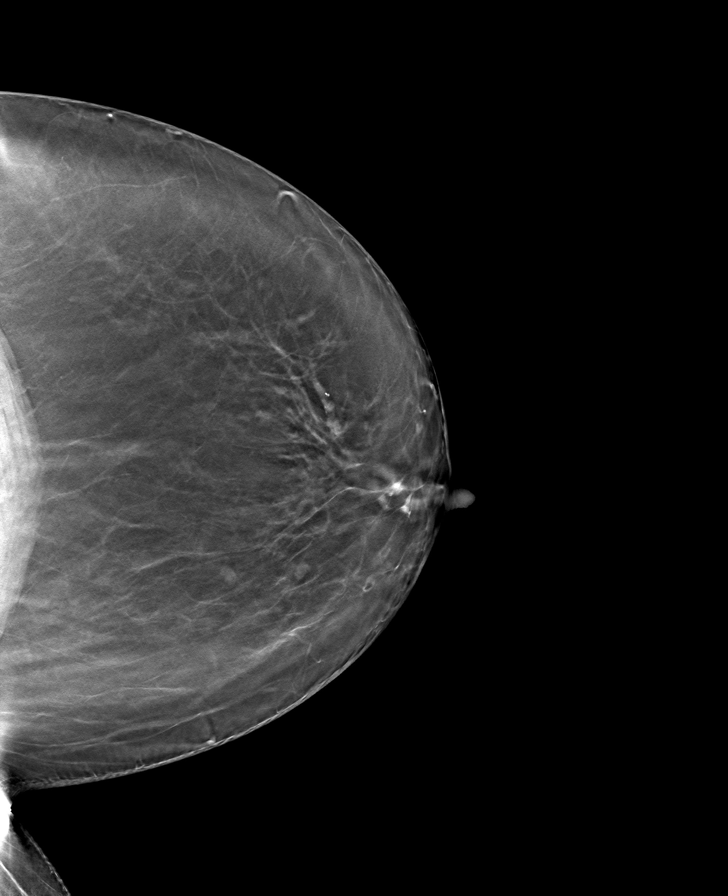

[L MLO tomo · tomo slice 41/81.0]
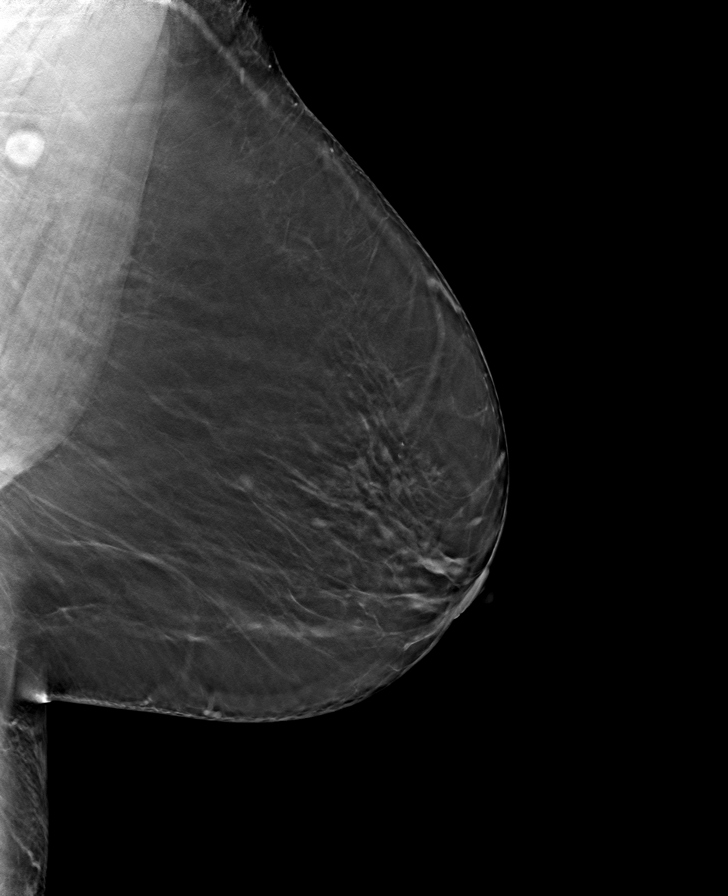

[8 of 15 positions shown; findings below may reference images not displayed]

No earlier
mammograms.

ACR Breast Density Category b: There are scattered areas of
fibroglandular density.
FINDINGS: Loosely grouped coarse calcifications are confirmed within the outer
LEFT breast, anterior to middle depth.

A partially obscured mass is confirmed within the inner LEFT breast,
anterior to middle depth, measuring 5 mm.

An additional oval circumscribed mass is confirmed within the inner
LEFT breast, at middle depth, measuring 6 mm.

A third oval circumscribed mass is confirmed within the outer LEFT
breast, at middle depth, measuring 3 mm.

No suspicious masses, suspicious calcifications or secondary signs
of malignancy are identified within the LEFT breast on today's exam.

Mammographic images were processed with CAD.

Targeted ultrasound is performed, with no sonographic correlate
identified for the small masses in the LEFT breast. Only normal
fibroglandular tissues and fat lobules are seen throughout.
IMPRESSION: 1. Probably benign calcifications within the upper-outer quadrant of
the LEFT breast, for which a follow-up LEFT breast diagnostic
mammogram, with magnification views, is recommended in 6 months to
ensure stability.
2. Multiple small probably benign circumscribed and partially
obscured masses within the LEFT breast, without sonographic
correlate. Recommend attention to these masses on the follow-up
diagnostic mammogram recommended above.

RECOMMENDATION:
LEFT breast diagnostic mammogram, with magnification views, and with
possible ultrasound, in 6 months.

I have discussed the findings and recommendations with the patient.
Results were also provided in writing at the conclusion of the
visit. If applicable, a reminder letter will be sent to the patient
regarding the next appointment.

BI-RADS CATEGORY  3: Probably benign.

## 2020-09-15 ENCOUNTER — Other Ambulatory Visit: Payer: Self-pay

## 2020-09-15 DIAGNOSIS — J309 Allergic rhinitis, unspecified: Secondary | ICD-10-CM

## 2020-09-15 DIAGNOSIS — K029 Dental caries, unspecified: Secondary | ICD-10-CM

## 2020-09-15 DIAGNOSIS — H01001 Unspecified blepharitis right upper eyelid: Secondary | ICD-10-CM

## 2020-09-15 MED ORDER — ALBUTEROL SULFATE HFA 108 (90 BASE) MCG/ACT IN AERS: 2.0000 | INHALATION_SPRAY | RESPIRATORY_TRACT | 3 refills | Status: DC | PRN

## 2020-09-15 MED ORDER — VALSARTAN-HYDROCHLOROTHIAZIDE 80-12.5 MG PO TABS
1.0000 | ORAL_TABLET | Freq: Every day | ORAL | 1 refills | Status: DC
Start: 2020-09-15 — End: 2020-12-11

## 2020-09-15 MED ORDER — FLUTICASONE PROPIONATE 50 MCG/ACT NA SUSP: 2.0000 | Freq: Every day | NASAL | 3 refills | Status: DC

## 2020-09-15 MED ORDER — OLOPATADINE HCL 0.1 % OP SOLN: 1.0000 [drp] | Freq: Two times a day (BID) | OPHTHALMIC | 6 refills | Status: DC | PRN

## 2020-09-15 MED ORDER — MONTELUKAST SODIUM 10 MG PO TABS: 10.0000 mg | ORAL_TABLET | Freq: Every day | ORAL | 3 refills | Status: DC

## 2020-09-15 NOTE — Telephone Encounter (Signed)
Patient calls nurse line requesting refills on "all of my medications". Patient is also requesting refill on penicillin, as she has not been able to find a dentist she can afford for her infected cavities. Informed patient that she may need a follow up appointment to further address this concern. Patient verbalizes understanding but would rather wait for response from PCP.   Please return call to patient at [phone].  Veronda Prude, RN

## 2020-09-15 NOTE — Telephone Encounter (Signed)
Patient needs a dental appointment.  Can no longer provide refills for this.  She would at the least need to be seen, but antibiotics without surgery is not recommended.  Can place CCM referral if she would like to see if our social worker can help with cost of dentist.

## 2020-09-20 ENCOUNTER — Other Ambulatory Visit: Payer: Self-pay | Admitting: Family Medicine

## 2020-09-20 DIAGNOSIS — K047 Periapical abscess without sinus: Secondary | ICD-10-CM

## 2020-09-20 NOTE — Telephone Encounter (Signed)
Called patient and informed of below.  Patient would like to have CCM referral for social worker to assist with dental care.   Veronda Prude, RN

## 2020-09-22 ENCOUNTER — Telehealth: Payer: Self-pay | Admitting: Family Medicine

## 2020-09-22 NOTE — Telephone Encounter (Signed)
   Telephone encounter was:  Successful.  09/22/2020 Name: Melissa Lloyd MRN: [id number] DOB: 12/23/1955  Melissa Lloyd is a 65 y.o. year old female who is a primary care patient of Unknown Jim, DO . The community resource team was consulted for assistance with Financial Difficulties related to finding the money for surgery for her teeth and fighting the [place] for help from her husband's disability and death from agent orange.   Care guide performed the following interventions: Patient provided with information about care guide support team and interviewed to confirm resource needs Discussed resources to assist with dental surgery but finding out more from [employer] group to find out what they can do for her to increase her income, find better insurance and get military funds to pay for her dental care.  Obtained verbal consent to place patient referral to [facility]..  Follow Up Plan:  Care guide will follow up with patient by phone over the next week and Care guide will outreach resources to assist patient with VA benefits from her deceased husband's service time and also see if they can help with the cost of her dental surgery needs.   Rojelio Brenner Care Guide, Embedded Care Coordination [facility], Care Management Phone: [phone] Email: [email or web address]

## 2020-09-25 ENCOUNTER — Telehealth: Payer: Self-pay | Admitting: Family Medicine

## 2020-09-25 NOTE — Telephone Encounter (Signed)
   Telephone encounter was:  Unsuccessful.  09/25/2020 Name: Melissa Lloyd MRN: [id number] DOB: [birth year]  Unsuccessful outbound call made today to assist with:  dental needs and husband's VA benefits  Outreach Attempt:  1st Attempt  A HIPAA compliant voice message was left requesting a return call.  Instructed patient to call back at [phone].  Rojelio Brenner Care Guide, Embedded Care Coordination [facility], Care Management Phone: [phone] Email: [email or web address]

## 2020-09-27 ENCOUNTER — Telehealth: Payer: Self-pay | Admitting: Family Medicine

## 2020-09-27 NOTE — Telephone Encounter (Signed)
   Telephone encounter was:  Successful.  09/27/2020 Name: Melissa Lloyd MRN: [id number] DOB: Jan 20, 1956  Melissa Lloyd is a 65 y.o. year old female who is a primary care patient of Unknown Jim, DO . The community resource team was consulted for assistance with Financial Difficulties related to dental surgery and getting her deceased husbands full benefits  Care guide performed the following interventions: Discussed resources to assist with dental surgery co-payment. I called my personal dentist to ask if he would be able to forgo getting the co-payment for the surgery and since she has dental insurance it would be covered, she just can't afford the co-payment. My dentist was busy at the moment but should be reaching back out to me by the end of the week or by the beginning of next week. I also spoke to pt about making a referral to [employer] group so that they can fight for his pension from the military to be paid to her.  Obtained verbal consent to place patient referral to [facility] [employer], she just needs to get all of his information so I can place the referral. She wasn't home at the time of the call so she will need to get back to me for that information..  Follow Up Plan:  Care guide will follow up with patient by phone over the next week and Care guide will outreach resources to assist patient with week  Rojelio Brenner Care Guide, Embedded Care Coordination [facility], Care Management Phone: [phone] Email: [email or web address]

## 2020-10-05 ENCOUNTER — Telehealth: Payer: Self-pay | Admitting: Family Medicine

## 2020-10-05 NOTE — Telephone Encounter (Signed)
   Telephone encounter was:  Successful.  10/05/2020 Name: Melissa Lloyd MRN: [id number] DOB: 01/26/1956  Melissa Lloyd is a 65 y.o. year old female who is a primary care patient of Unknown Jim, DO . The community resource team was consulted for assistance with dental care and getting her husband's benefits from the [facility]  Care guide performed the following interventions: Discussed resources to assist with dental care. I called my personal dentist and he agreed to only charge her dental insurance [employer] and not charge her the co-pay because he knows she can't afford it. We made an appointment over the phone for May 19th at 1pm for a consultation and to see what she really needs done. As for the military benefits part I am waiting until I can get more information from her about her husband to place that referral to [facility].  Follow up call placed to the patient to discuss status of referral.  Follow Up Plan:  Care guide will follow up with patient by phone over the next week and Client will get me the information regarding her husband's case against the VA for his benefits.   Rojelio Brenner Care Guide, Embedded Care Coordination [facility], Care Management Phone: [phone] Email: [email or web address]

## 2020-10-06 ENCOUNTER — Telehealth: Payer: Self-pay | Admitting: Family Medicine

## 2020-10-06 NOTE — Telephone Encounter (Signed)
   Telephone encounter was:  Successful.  10/06/2020 Name: Melissa Lloyd MRN: [id number] DOB: 09/28/1955  Melissa Lloyd is a 65 y.o. year old female who is a primary care patient of Unknown Jim, DO . The community resource team was consulted for assistance with Financial Difficulties related to dental care and husband's veteran services.  Care guide performed the following interventions: Discussed resources to assist with dental needs and getting pt her husband's disability pay after his death.  Placed referral to [facility] via email. .  Follow Up Plan:  No further follow up planned at this time. The patient has been provided with needed resources.  Rojelio Brenner Care Guide, Embedded Care Coordination [facility], Care Management Phone: [phone] Email: [email or web address]

## 2020-11-27 ENCOUNTER — Telehealth: Payer: Self-pay | Admitting: Family Medicine

## 2020-11-27 NOTE — Telephone Encounter (Signed)
Resident living form dropped off for at front desk for completion.  Verified that patient section of form has been completed.  Last DOS/WCC with PCP was 02/14/20.  Placed form in team folder to be completed by clinical staff.  Melissa Lloyd

## 2020-11-29 NOTE — Telephone Encounter (Signed)
Clinical info completed on Support Dog Letter.  Placed form in Dr Tamela Oddi box for completion.  Sunday Spillers, CMA

## 2020-11-29 NOTE — Telephone Encounter (Signed)
Reviewed, completed, and signed form.  Note routed to RN team inbasket and placed completed form in Clinic RN's office (wall pocket above desk).  Veda Arrellano J Aminah Zabawa, DO  

## 2020-12-04 NOTE — Telephone Encounter (Signed)
Patient contacted and advised of support letter ready for pick up. The letter has been placed up front for pick up.

## 2020-12-08 NOTE — Progress Notes (Signed)
    SUBJECTIVE:   CHIEF COMPLAINT / HPI:   Pap Smear Last Pap smear 11/2019 with LSIL, neg HPV, per ASCCP needs repeat in 1 yr.  Prior paps: ASCUS Patient's last menstrual period was 07/19/2011. Postmenopausal bleeding: still having some bleeding, but improving, see below Contraception: none  Sexually Active: yes  Desire for STD Screening: yes  Last mammogram: 03/2019, see below Concerns: none   Follow-up abnormal uterine bleeding Patient was seen in August 2021 for this problem She had an endometrial biopsy performed on 8/30 that was normal with a polyp She never followed up for a transvaginal ultrasound Continues to have some bleeding  History of left breast benign small masses noted on mammogram Last mammogram performed in October 2020 She was due for a repeat in October 2021  HTN Current Regimen: Valsartan-HCTZ 80-12.5mg  Last BMP 11/2019 She states that it usually good, but she is very stressed today and this is likely cause of her elevated pressure, she also just took her blood pressure medication  Right Ear Popping About 2-3 days Otherwise feeling well Has sinus drainage   PERTINENT  PMH / PSH: HTN, Allergic rhinitis, OA, AUB  OBJECTIVE:   BP (!) 160/90   Pulse 84   Ht 5\' 1"  (1.549 m)   Wt 147 lb (66.7 kg)   LMP 07/19/2011   SpO2 98%   BMI 27.78 kg/m    Physical Exam:  General: 65 y.o. female in NAD HEENT: B/L Tms clear and non-bulging, b/l nares clear with mild rhinorrhea Cardio: RRR no m/r/g Lungs: CTAB, no wheezing, no rhonchi, no crackles, no IWOB on RA Skin: warm and dry  GU: Pelvic exam performed with patient supine.  Chaperone in room.  Bilateral labia without abnormalities.  Cervix exhibits no discharge, no cervix abnormalities.  No vaginal lesions.  Vaginal discharge thin, white.    ASSESSMENT/PLAN:   Abnormal uterine bleeding Continues to have some bleeding.  Never completed full work-up.  Will obtain ultrasound to complete this work-up.    Screening for malignant neoplasm of cervix Last Pap LSIL.  Repeated today per ASCCP guidelines.  Will f/u results.  Screen for sexually transmitted diseases G/C, trichomonas, HIV, RPR obtained today for screening.  Lump or mass in breast History of masses that are being monitored on mammogram.  Overdue for diagnostic mammogram since Oct 2021.  Ordered today.  HYPERTENSION, BENIGN ESSENTIAL Above goal.  She reports has been fine and today is related to stress and taking BP meds later.  Will obtain BMP with other labs and f/u in 1 month with new PCP.  Continue current meds.  Dysfunction of right eustachian tube Exam WNL.  Will treat with flonase, instructed on proper positioning.  F/U if no improvement.     Nov 2021, DO [facility]

## 2020-12-11 ENCOUNTER — Encounter: Payer: Self-pay | Admitting: Family Medicine

## 2020-12-11 ENCOUNTER — Ambulatory Visit (INDEPENDENT_AMBULATORY_CARE_PROVIDER_SITE_OTHER): Admitting: Family Medicine

## 2020-12-11 ENCOUNTER — Other Ambulatory Visit (HOSPITAL_COMMUNITY)
Admission: RE | Admit: 2020-12-11 | Discharge: 2020-12-11 | Disposition: A | Discharge status: Home / Self Care | Source: Ambulatory Visit | Attending: Family Medicine | Admitting: Family Medicine

## 2020-12-11 ENCOUNTER — Other Ambulatory Visit: Payer: Self-pay

## 2020-12-11 VITALS — BP 160/90 | HR 84 | Ht 61.0 in | Wt 147.0 lb

## 2020-12-11 DIAGNOSIS — Z113 Encounter for screening for infections with a predominantly sexual mode of transmission: Secondary | ICD-10-CM | POA: Diagnosis present

## 2020-12-11 DIAGNOSIS — H6991 Unspecified Eustachian tube disorder, right ear: Secondary | ICD-10-CM | POA: Insufficient documentation

## 2020-12-11 DIAGNOSIS — H6981 Other specified disorders of Eustachian tube, right ear: Secondary | ICD-10-CM | POA: Insufficient documentation

## 2020-12-11 DIAGNOSIS — Z124 Encounter for screening for malignant neoplasm of cervix: Secondary | ICD-10-CM | POA: Insufficient documentation

## 2020-12-11 DIAGNOSIS — Z1151 Encounter for screening for human papillomavirus (HPV): Secondary | ICD-10-CM | POA: Diagnosis not present

## 2020-12-11 DIAGNOSIS — I10 Essential (primary) hypertension: Secondary | ICD-10-CM

## 2020-12-11 DIAGNOSIS — N939 Abnormal uterine and vaginal bleeding, unspecified: Secondary | ICD-10-CM

## 2020-12-11 DIAGNOSIS — N63 Unspecified lump in unspecified breast: Secondary | ICD-10-CM

## 2020-12-11 DIAGNOSIS — J309 Allergic rhinitis, unspecified: Secondary | ICD-10-CM

## 2020-12-11 MED ORDER — MONTELUKAST SODIUM 10 MG PO TABS: 10.0000 mg | ORAL_TABLET | Freq: Every day | ORAL | 3 refills | Status: DC

## 2020-12-11 MED ORDER — FLUTICASONE PROPIONATE 50 MCG/ACT NA SUSP: 2.0000 | Freq: Every day | NASAL | 3 refills | Status: DC

## 2020-12-11 MED ORDER — VALSARTAN-HYDROCHLOROTHIAZIDE 80-12.5 MG PO TABS: 1.0000 | ORAL_TABLET | Freq: Every day | ORAL | 1 refills | Status: DC

## 2020-12-11 NOTE — Assessment & Plan Note (Signed)
Above goal.  She reports has been fine and today is related to stress and taking BP meds later.  Will obtain BMP with other labs and f/u in 1 month with new PCP.  Continue current meds.

## 2020-12-11 NOTE — Assessment & Plan Note (Signed)
Exam WNL.  Will treat with flonase, instructed on proper positioning.  F/U if no improvement.

## 2020-12-11 NOTE — Assessment & Plan Note (Signed)
Continues to have some bleeding.  Never completed full work-up.  Will obtain ultrasound to complete this work-up.

## 2020-12-11 NOTE — Assessment & Plan Note (Signed)
Last Pap LSIL.  Repeated today per ASCCP guidelines.  Will f/u results.

## 2020-12-11 NOTE — Assessment & Plan Note (Signed)
G/C, trichomonas, HIV, RPR obtained today for screening.

## 2020-12-11 NOTE — Assessment & Plan Note (Signed)
History of masses that are being monitored on mammogram.  Overdue for diagnostic mammogram since Oct 2021.  Ordered today.

## 2020-12-11 NOTE — Patient Instructions (Addendum)
Thank you for coming to see me today. It was a pleasure. Today we talked about:   I have placed an order for your mammogram.  Please call [facility] at [phone] to schedule your appointment within one week.   We will get you scheduled for an ultrasound for your bleeding.   We performed a pap smear and STD testing today. This will take a few days to come back. If your MyChart is activated, we will message you on there if everything is normal, otherwise we will send a letter. If something else needs to be done, we will call you. If you do not hear from [place] in the next week, please give [place] a call.  Use flonase for your ear popping.   Please follow-up with new PCP in 1 month.  If you have any questions or concerns, please do not hesitate to call the office at [phone].  Best,   Luis Abed, DO

## 2020-12-12 ENCOUNTER — Encounter: Payer: Self-pay | Admitting: Family Medicine

## 2020-12-12 LAB — BASIC METABOLIC PANEL WITH GFR
BUN/Creatinine Ratio: 9 — ABNORMAL LOW (ref 12–28)
BUN: 9 mg/dL (ref 8–27)
CO2: 22 mmol/L (ref 20–29)
Calcium: 9.9 mg/dL (ref 8.7–10.3)
Chloride: 100 mmol/L (ref 96–106)
Creatinine, Ser: 0.98 mg/dL (ref 0.57–1.00)
Glucose: 87 mg/dL (ref 65–99)
Potassium: 3.8 mmol/L (ref 3.5–5.2)
Sodium: 140 mmol/L (ref 134–144)
eGFR: 65 mL/min/{1.73_m2}

## 2020-12-12 LAB — HIV ANTIBODY (ROUTINE TESTING W REFLEX): HIV Screen 4th Generation wRfx: NONREACTIVE

## 2020-12-12 LAB — RPR: RPR Ser Ql: NONREACTIVE

## 2020-12-13 LAB — CYTOLOGY - PAP
Chlamydia: NEGATIVE
Comment: NEGATIVE
Comment: NEGATIVE
Comment: NEGATIVE
Comment: NORMAL
Diagnosis: NEGATIVE
Diagnosis: REACTIVE
High risk HPV: NEGATIVE
Neisseria Gonorrhea: NEGATIVE
Trichomonas: NEGATIVE

## 2020-12-25 ENCOUNTER — Ambulatory Visit (HOSPITAL_COMMUNITY)

## 2021-01-29 ENCOUNTER — Other Ambulatory Visit: Payer: Self-pay

## 2021-01-29 ENCOUNTER — Encounter

## 2021-01-29 DIAGNOSIS — J309 Allergic rhinitis, unspecified: Secondary | ICD-10-CM

## 2021-01-29 MED ORDER — FLUTICASONE PROPIONATE 50 MCG/ACT NA SUSP: 2.0000 | Freq: Every day | NASAL | 3 refills | Status: DC

## 2021-01-29 MED ORDER — ALBUTEROL SULFATE HFA 108 (90 BASE) MCG/ACT IN AERS: 2.0000 | INHALATION_SPRAY | RESPIRATORY_TRACT | 3 refills | Status: DC | PRN

## 2021-02-05 ENCOUNTER — Ambulatory Visit

## 2021-02-12 ENCOUNTER — Encounter: Payer: Self-pay | Admitting: Family Medicine

## 2021-02-12 ENCOUNTER — Ambulatory Visit (INDEPENDENT_AMBULATORY_CARE_PROVIDER_SITE_OTHER): Admitting: Family Medicine

## 2021-02-12 ENCOUNTER — Other Ambulatory Visit: Payer: Self-pay

## 2021-02-12 VITALS — BP 152/82 | HR 75 | Ht 61.0 in | Wt 149.2 lb

## 2021-02-12 DIAGNOSIS — R059 Cough, unspecified: Secondary | ICD-10-CM | POA: Diagnosis not present

## 2021-02-12 DIAGNOSIS — I10 Essential (primary) hypertension: Secondary | ICD-10-CM | POA: Diagnosis not present

## 2021-02-12 DIAGNOSIS — J309 Allergic rhinitis, unspecified: Secondary | ICD-10-CM | POA: Diagnosis not present

## 2021-02-12 NOTE — Assessment & Plan Note (Signed)
Patient reporting cough and significant post nasal drip. Symptoms appear to be related to her allergic rhinitis. However, she has been using Albuterol inhaler several times per day and is requesting a nebulizer. No formal diagnosis of asthma. -Referral to asthma and allergy center to better characterize diagnosis (asthma vs allergies vs other) -Counseled on reducing Albuterol use -Continue singulair and flonase -Discussed that I do not feel nebulizer would be appropriate treatment at this time until we have a better understanding of whether or not she has asthma

## 2021-02-12 NOTE — Assessment & Plan Note (Signed)
BP above goal today. 146/72 and then 152/82 on repeat check. Patient reports it's because she was thinking about stressful things. She is not interested in discussing her BP further. Does not want to check her BP at home although she does have a cuff. States she uses a lot of salt in her diet. -Continue Valsartan-HCTZ 80-12.5mg  daily -Counseled on limiting salt intake -Encouraged to check BP at home -Continue to address at future appointments if patient is willing

## 2021-02-12 NOTE — Patient Instructions (Signed)
It was great to meet you!  Things we discussed at today's visit: - I'd like you to see a specialist who deals with allergies and asthma. They can help [place] make sure we're treating the right diagnosis. Someone will call you to schedule an appointment.  - Your symptoms seem to be related to your allergies more than asthma. - The Albuterol is designed as a rescue medication for asthma, and ideally is not used more than twice per week. For this reason, I do not think a nebulizer is appropriate right now. - Your blood pressure was high today. If you decide you're interested, please check your blood pressure at home and bring a written log to your next appointment. It is also important to limit your salt intake.  Take care and seek immediate care sooner if you develop any concerns.  Dr. [facility]

## 2021-02-12 NOTE — Progress Notes (Signed)
    SUBJECTIVE:   CHIEF COMPLAINT / HPI:   Requesting Nebulizer Patient here to discuss possible nebulizer. Currently uses Albuterol but only gets one inhaler from her mail order pharmacy. Thinks a nebulizer machine would be more useful. States she used her Grandson's once and it helped with her symptoms. She reports a lot of coughing at night. Endorses significant post-nasal drip. Currently using albuterol every single day (often multiple times per day) due to cough, some chest tightness. Reports they have been cutting grass a lot at her apartment and this is bothersome to her. No formally diagnosed history of asthma.   PERTINENT  PMH / PSH: Allergic rhinitis  OBJECTIVE:   BP (!) 152/82   Pulse 75   Ht 5\' 1"  (1.549 m)   Wt 149 lb 3.2 oz (67.7 kg)   LMP 07/19/2011   SpO2 100%   BMI 28.19 kg/m   General: NAD, pleasant, able to participate in exam Cardiac: RRR, S1 S2 present. normal heart sounds, no murmurs. Respiratory: CTAB, normal effort, No wheezes, rales or rhonchi Extremities: no edema or cyanosis. Skin: warm and dry, no rashes noted Neuro: alert, no obvious focal deficits Psych: Normal affect and mood   ASSESSMENT/PLAN:   Cough Patient reporting cough and significant post nasal drip. Symptoms appear to be related to her allergic rhinitis. However, she has been using Albuterol inhaler several times per day and is requesting a nebulizer. No formal diagnosis of asthma. -Referral to asthma and allergy center to better characterize diagnosis (asthma vs allergies vs other) -Counseled on reducing Albuterol use -Continue singulair and flonase -Discussed that I do not feel nebulizer would be appropriate treatment at this time until we have a better understanding of whether or not she has asthma  HYPERTENSION, BENIGN ESSENTIAL BP above goal today. 146/72 and then 152/82 on repeat check. Patient reports it's because she was thinking about stressful things. She is not interested  in discussing her BP further. Does not want to check her BP at home although she does have a cuff. States she uses a lot of salt in her diet. -Continue Valsartan-HCTZ 80-12.5mg  daily -Counseled on limiting salt intake -Encouraged to check BP at home -Continue to address at future appointments if patient is willing   09/16/2011, MD [facility]

## 2021-02-26 ENCOUNTER — Other Ambulatory Visit: Payer: Self-pay

## 2021-02-26 ENCOUNTER — Ambulatory Visit
Admission: RE | Admit: 2021-02-26 | Discharge: 2021-02-26 | Disposition: A | Discharge status: Home / Self Care | Source: Ambulatory Visit | Attending: Family Medicine | Admitting: Family Medicine

## 2021-02-26 DIAGNOSIS — N63 Unspecified lump in unspecified breast: Secondary | ICD-10-CM

## 2021-04-15 ENCOUNTER — Encounter (HOSPITAL_COMMUNITY): Payer: Self-pay | Admitting: Emergency Medicine

## 2021-04-15 ENCOUNTER — Ambulatory Visit (HOSPITAL_COMMUNITY)
Admission: EM | Admit: 2021-04-15 | Discharge: 2021-04-15 | Disposition: A | Discharge status: Home / Self Care | Attending: Emergency Medicine | Admitting: Emergency Medicine

## 2021-04-15 DIAGNOSIS — U071 COVID-19: Secondary | ICD-10-CM | POA: Diagnosis not present

## 2021-04-15 MED ORDER — NIRMATRELVIR/RITONAVIR (PAXLOVID)TABLET: 3.0000 | ORAL_TABLET | Freq: Two times a day (BID) | ORAL | 0 refills | Status: AC

## 2021-04-15 NOTE — ED Provider Notes (Signed)
[facility]    CSN: [id number] Arrival date & time: 04/15/21  1313      History   Chief Complaint Chief Complaint  Patient presents with   Covid Positive   Diarrhea   Fatigue    HPI Melissa Lloyd is a 65 y.o. female.   Patient here for evaluation of chills, diarrhea, body aches, fatigue, and congestion that has been ongoing for the past 2 days.  Reports taking an at home COVID test that was positive.  Reports taking some OTC medication with minimal symptom relief.  Denies any trauma, injury, or other precipitating event.  Denies any specific alleviating or aggravating factors.  Denies any chest pain, shortness of breath, numbness, tingling, or headaches.    The history is provided by the patient and a caregiver.  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      Home Medications    Prior to Admission medications   Medication Sig Start Date End Date Taking? Authorizing Provider  nirmatrelvir/ritonavir EUA (PAXLOVID) 20 x 150 MG & 10 x 100MG  TABS Take 3 tablets by mouth 2 (two) times daily for 5 days. Patient GFR is 65. Take  nirmatrelvir (150 mg) two tablets twice daily for 5 days and ritonavir (100 mg) one tablet twice daily for 5 days. 04/15/21 04/20/21 Yes 13/4/22, NP  albuterol (VENTOLIN HFA) 108 (90 Base) MCG/ACT inhaler Inhale 2 puffs into the lungs every 4 (four) hours as needed for wheezing or shortness of breath. 01/29/21   01/31/21, MD  fluticasone (FLONASE) 50 MCG/ACT nasal spray Place 2 sprays into both nostrils daily. 01/29/21   01/31/21, MD  montelukast (SINGULAIR) 10 MG tablet Take 1 tablet (10 mg total) by mouth daily. 12/11/20   Meccariello, 12/13/20, DO  olopatadine (PATANOL) 0.1 % ophthalmic solution Place 1 drop into both eyes 2 (two) times daily as needed for allergies. 09/15/20   Meccariello, 11/15/20, DO  valsartan-hydrochlorothiazide (DIOVAN-HCT) 80-12.5 MG tablet Take 1 tablet by mouth daily. 12/11/20   Meccariello, 12/13/20, DO    Family History Family History  Problem Relation Age of Onset   Hypertension Mother    Cancer Mother 10       lung cancer in former smoker   Hypertension Father    Heart disease Brother     Social History Social History   Tobacco Use   Smoking status: Former   Smokeless tobacco: Never   Tobacco comments:    smoked years ago for 3-4 years  Vaping Use   Vaping Use: Never used  Substance Use Topics   Alcohol  use: Yes    Comment: drinks at parties but did have DWI after drinking at a party. Denies daily EtOH use   Drug use: No     Allergies   Patient has no known allergies.   Review of Systems Review of Systems  Constitutional:  Positive for chills and fatigue.  HENT:  Positive for congestion.   Respiratory:  Positive for cough.   Gastrointestinal:  Positive for abdominal pain and diarrhea.  Musculoskeletal:  Positive for myalgias.    Physical Exam Triage Vital Signs ED Triage Vitals  Enc Vitals Group     BP 04/15/21 1320 107/79     Pulse Rate 04/15/21 1320 88     Resp 04/15/21 1320 (!) 24     Temp 04/15/21 1320 99.8 F  (37.7 C)     Temp Source 04/15/21 1320 Oral     SpO2 04/15/21 1320 100 %     Weight --      Height --      Head Circumference --      Peak Flow --      Pain Score 04/15/21 1319 8     Pain Loc --      Pain Edu? --      Excl. in GC? --    No data found.  Updated Vital Signs BP 107/79 (BP Location: Right Arm)   Pulse 88   Temp 99.8 F (37.7 C) (Oral)   Resp (!) 24   LMP 07/19/2011   SpO2 100%   Visual Acuity Right Eye Distance:   Left Eye Distance:   Bilateral Distance:    Right Eye Near:   Left Eye Near:    Bilateral Near:     Physical Exam Vitals and nursing note reviewed.  Constitutional:      General: She is not in acute distress.    Appearance: Normal appearance. She is not ill-appearing, toxic-appearing or diaphoretic.  HENT:     Head: Normocephalic and atraumatic.     Nose: Congestion present.  Eyes:     Conjunctiva/sclera: Conjunctivae normal.  Cardiovascular:     Rate and Rhythm: Normal rate and regular rhythm.     Pulses: Normal pulses.     Heart sounds: Normal heart sounds.  Pulmonary:     Effort: Pulmonary effort is normal.     Breath sounds: Normal breath sounds.  Abdominal:     General: Abdomen is flat.  Musculoskeletal:        General: Normal range of motion.     Cervical back: Normal range of motion.  Skin:    General: Skin is warm and dry.  Neurological:     General: No focal deficit present.     Mental Status: She is alert and oriented to person, place, and time.  Psychiatric:        Mood and Affect: Mood normal.     UC Treatments / Results  Labs (all labs ordered are listed, but only abnormal results are displayed) Labs Reviewed - No data to display  EKG   Radiology No results found.  Procedures Procedures (including critical care time)  Medications Ordered in UC Medications - No data to display  Initial Impression / Assessment and Plan / UC Course  I have reviewed the triage vital signs and the nursing  notes.  Pertinent labs & imaging results that were available during my care of the patient were reviewed by me and considered in my medical decision making (see chart for details).  Assessment negative for red flags or concerns.  As patient had a positive home COVID test repeat testing is not necessary at this time.  As symptoms have started within the past 5 days patient is a candidate for antiviral treatment.  No history of CKD and last GFR was 65.  Therefore patient may take paxlovid.  May take Tylenol and or ibuprofen as needed.  Encourage fluids and rest.  Discussed conservative symptom management as described in discharge instructions.  Follow-up with primary care for reevaluation as needed.  Strict ED follow-up for any red flag symptoms. Final Clinical Impressions(s) / UC Diagnoses   Final diagnoses:  COVID-19     Discharge Instructions      Take the paxlovid twice a day for the next 5 days.   You can take Tylenol and/or Ibuprofen as needed for fever reduction and pain relief.   For cough: honey 1/2 to 1 teaspoon (you can dilute the honey in water or another fluid).  You can also use guaifenesin and dextromethorphan for cough. You can use a humidifier for chest congestion and cough.  If you don't have a humidifier, you can sit in the bathroom with the hot shower running.     For sore throat: try warm salt water gargles, cepacol lozenges, throat spray, warm tea or water with lemon/honey, popsicles or ice, or OTC cold relief medicine for throat discomfort.    For congestion: take a daily anti-histamine like Zyrtec, Claritin, and a oral decongestant, such as pseudoephedrine.  You can also use Flonase 1-2 sprays in each nostril daily.    It is important to stay hydrated: drink plenty of fluids (water, gatorade/powerade/pedialyte, juices, or teas) to keep your throat moisturized and help further relieve irritation/discomfort.   Return or go to the Emergency Department if symptoms  worsen or do not improve in the next few days.  If you develop the worse headache of your life, worsening dizziness, nausea/vomiting, blurred vision, slurred speech, difficulty walking, weakness on one side, chest pain, shortness of breath, or altered mental status, call 911 or go directly to the Emergency Department for further evaluation.       ED Prescriptions     Medication Sig Dispense Auth. Provider   nirmatrelvir/ritonavir EUA (PAXLOVID) 20 x 150 MG & 10 x 100MG  TABS Take 3 tablets by mouth 2 (two) times daily for 5 days. Patient GFR is 65. Take nirmatrelvir (150 mg) two tablets twice daily for 5 days and ritonavir (100 mg) one tablet twice daily for 5 days. 30 tablet , NP      PDMP not reviewed this encounter.   Ivette Loyal, NP 04/15/21 1451

## 2021-04-15 NOTE — Discharge Instructions (Signed)
Take the paxlovid twice a day for the next 5 days.   You can take Tylenol and/or Ibuprofen as needed for fever reduction and pain relief.   For cough: honey 1/2 to 1 teaspoon (you can dilute the honey in water or another fluid).  You can also use guaifenesin and dextromethorphan for cough. You can use a humidifier for chest congestion and cough.  If you don't have a humidifier, you can sit in the bathroom with the hot shower running.     For sore throat: try warm salt water gargles, cepacol lozenges, throat spray, warm tea or water with lemon/honey, popsicles or ice, or OTC cold relief medicine for throat discomfort.    For congestion: take a daily anti-histamine like Zyrtec, Claritin, and a oral decongestant, such as pseudoephedrine.  You can also use Flonase 1-2 sprays in each nostril daily.    It is important to stay hydrated: drink plenty of fluids (water, gatorade/powerade/pedialyte, juices, or teas) to keep your throat moisturized and help further relieve irritation/discomfort.   Return or go to the Emergency Department if symptoms worsen or do not improve in the next few days.  If you develop the worse headache of your life, worsening dizziness, nausea/vomiting, blurred vision, slurred speech, difficulty walking, weakness on one side, chest pain, shortness of breath, or altered mental status, call 911 or go directly to the Emergency Department for further evaluation.

## 2021-04-15 NOTE — ED Triage Notes (Signed)
Pt took home test that was Coid+. Feeling weak, chills, and diarrhea. Having abd pains and leg pains.

## 2021-04-17 ENCOUNTER — Ambulatory Visit

## 2021-04-23 ENCOUNTER — Ambulatory Visit: Payer: Self-pay | Admitting: Allergy

## 2021-04-25 ENCOUNTER — Telehealth: Payer: Self-pay

## 2021-04-25 NOTE — Telephone Encounter (Signed)
Patient calls nurse line requesting ointment for under eyes. Reports that eye lids have been swollen with bumps under eyes with white heads. Patient reports that she has been using warm compresses with little improvement.   Advised patient that she would likely need to be seen prior to abx ointment being sent in. Scheduled patient for Monday afternoon, 11/14.  Veronda Prude, RN

## 2021-04-30 ENCOUNTER — Ambulatory Visit

## 2021-05-04 ENCOUNTER — Ambulatory Visit (INDEPENDENT_AMBULATORY_CARE_PROVIDER_SITE_OTHER): Admitting: Family Medicine

## 2021-05-04 ENCOUNTER — Other Ambulatory Visit: Payer: Self-pay

## 2021-05-04 VITALS — BP 154/72 | HR 74 | Ht 61.0 in | Wt 145.6 lb

## 2021-05-04 DIAGNOSIS — J309 Allergic rhinitis, unspecified: Secondary | ICD-10-CM | POA: Diagnosis not present

## 2021-05-04 DIAGNOSIS — Z23 Encounter for immunization: Secondary | ICD-10-CM

## 2021-05-04 DIAGNOSIS — H01009 Unspecified blepharitis unspecified eye, unspecified eyelid: Secondary | ICD-10-CM

## 2021-05-04 MED ORDER — ALBUTEROL SULFATE HFA 108 (90 BASE) MCG/ACT IN AERS: 2.0000 | INHALATION_SPRAY | RESPIRATORY_TRACT | 3 refills | Status: DC | PRN

## 2021-05-04 MED ORDER — BACITRACIN-POLYMYXIN B 500-10000 UNIT/GM OP OINT: 1.0000 "application " | TOPICAL_OINTMENT | Freq: Two times a day (BID) | OPHTHALMIC | 0 refills | Status: DC

## 2021-05-04 MED ORDER — FLUTICASONE PROPIONATE 50 MCG/ACT NA SUSP: 2.0000 | Freq: Every day | NASAL | 3 refills | Status: DC

## 2021-05-04 NOTE — Progress Notes (Signed)
    SUBJECTIVE:   CHIEF COMPLAINT / HPI:   Melissa Lloyd is a 65 yo who presents for swollen L  eyelid and flu shot. She states her L eye was more swollen previously and she first noticed in on 11/7 but she has been using warm compresses which have brought it down.  States she recently had COVID in the past couple of weeks (tested positive at home) and after that noticed her L eye swollen. States she had the "craps and stomach pain" Took paxlovid which she felt really helped   Requesting polysporin ointment which she states has been beneficial for her in the past. Tried using her eye drops but has not helped much   OBJECTIVE:   LMP 07/19/2011    Physical exam General: well appearing, NAD HEENT: L upper eyelid erythematous and edematous. PERRL. Sclera normal. EOMI Cardiovascular: RRR, no murmurs Lungs: CTAB. Normal WOB Abdomen: soft, non-distended, non-tender Skin: warm, dry. No edema   ASSESSMENT/PLAN:   No problem-specific Assessment & Plan notes found for this encounter.   Blepharitis  Patient presents with L upper eyelid erythema and edema. Sclera normal and pupils equal and reactive to light.No acute changes in vision. Recommended continued warm compresses. Refilled polysporin per patients request to use once every 12 hours during flare.   Health maintenance Flu vaccine administered    Cora Collum, DO [facility]

## 2021-05-04 NOTE — Patient Instructions (Addendum)
It was great seeing you today!  For your Blepharitis I have refilled your Polysporin and your other medications you need refilled Continue using warm compresses on your L eye as well   Please check-out at the front desk before leaving the clinic. I'd like to see you back in but if you need to be seen earlier than that for any new issues we're happy to fit you in, just give [place] a call!  Visit Reminders: - Stop by the pharmacy to pick up your prescriptions  - Continue to work on your healthy eating habits and incorporating exercise into your daily life.   Feel free to call with any questions or concerns at any time, at [phone].   Take care,  Dr. Cora Collum [facility]

## 2021-05-17 ENCOUNTER — Ambulatory Visit (HOSPITAL_COMMUNITY)
Admission: EM | Admit: 2021-05-17 | Discharge: 2021-05-17 | Disposition: A | Discharge status: Home / Self Care | Attending: Internal Medicine | Admitting: Internal Medicine

## 2021-05-17 ENCOUNTER — Other Ambulatory Visit: Payer: Self-pay

## 2021-05-17 ENCOUNTER — Encounter (HOSPITAL_COMMUNITY): Payer: Self-pay

## 2021-05-17 DIAGNOSIS — H538 Other visual disturbances: Secondary | ICD-10-CM

## 2021-05-17 DIAGNOSIS — S0003XA Contusion of scalp, initial encounter: Secondary | ICD-10-CM | POA: Diagnosis not present

## 2021-05-17 NOTE — Discharge Instructions (Addendum)
Take medication as needed for pain  Please make an appointment with the Ophthalmologist for further evaluation of eye blurriness
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
## 2021-08-20 ENCOUNTER — Other Ambulatory Visit: Payer: Self-pay

## 2021-08-20 DIAGNOSIS — I10 Essential (primary) hypertension: Secondary | ICD-10-CM

## 2021-08-20 DIAGNOSIS — H01004 Unspecified blepharitis left upper eyelid: Secondary | ICD-10-CM

## 2021-08-20 DIAGNOSIS — J309 Allergic rhinitis, unspecified: Secondary | ICD-10-CM

## 2021-08-20 MED ORDER — BACITRACIN-POLYMYXIN B 500-10000 UNIT/GM OP OINT: 1.0000 "application " | TOPICAL_OINTMENT | Freq: Two times a day (BID) | OPHTHALMIC | 2 refills | Status: DC

## 2021-08-20 MED ORDER — VALSARTAN-HYDROCHLOROTHIAZIDE 80-12.5 MG PO TABS: 1.0000 | ORAL_TABLET | Freq: Every day | ORAL | 0 refills | Status: DC

## 2021-08-20 MED ORDER — ALBUTEROL SULFATE HFA 108 (90 BASE) MCG/ACT IN AERS: 2.0000 | INHALATION_SPRAY | RESPIRATORY_TRACT | 3 refills | Status: DC | PRN

## 2021-08-20 MED ORDER — OLOPATADINE HCL 0.1 % OP SOLN: 1.0000 [drp] | Freq: Two times a day (BID) | OPHTHALMIC | 0 refills | Status: AC | PRN

## 2021-08-20 MED ORDER — FLUTICASONE PROPIONATE 50 MCG/ACT NA SUSP: 2.0000 | Freq: Every day | NASAL | 3 refills | Status: DC

## 2021-08-20 MED ORDER — MONTELUKAST SODIUM 10 MG PO TABS: 10.0000 mg | ORAL_TABLET | Freq: Every day | ORAL | 0 refills | Status: DC

## 2021-08-20 NOTE — Telephone Encounter (Signed)
Pt requesting a 90 day supply of all medications. Please send enough inhalers for 90 days. Melissa Lloyd, CMA ?  ?

## 2022-02-04 ENCOUNTER — Emergency Department (HOSPITAL_COMMUNITY)
Admission: EM | Admit: 2022-02-04 | Discharge: 2022-02-05 | Disposition: A | Discharge status: Home / Self Care | Attending: Emergency Medicine | Admitting: Emergency Medicine

## 2022-02-04 DIAGNOSIS — R42 Dizziness and giddiness: Secondary | ICD-10-CM | POA: Diagnosis not present

## 2022-02-04 DIAGNOSIS — I10 Essential (primary) hypertension: Secondary | ICD-10-CM | POA: Diagnosis not present

## 2022-02-04 DIAGNOSIS — R0602 Shortness of breath: Secondary | ICD-10-CM | POA: Insufficient documentation

## 2022-02-04 DIAGNOSIS — R7309 Other abnormal glucose: Secondary | ICD-10-CM | POA: Insufficient documentation

## 2022-02-04 NOTE — ED Triage Notes (Signed)
Patient arrived stating she shampooed her rug today just prior to arrival and it made her feel dizzy. States she is starting to feel better since being out of the house.

## 2022-02-05 ENCOUNTER — Encounter (HOSPITAL_COMMUNITY): Payer: Self-pay

## 2022-02-05 LAB — CBG MONITORING, ED: Glucose-Capillary: 159 mg/dL — ABNORMAL HIGH (ref 70–99)

## 2022-02-05 MED ORDER — ONDANSETRON 4 MG PO TBDP
4.0000 mg | ORAL_TABLET | Freq: Once | ORAL | Status: AC
  Administered 2022-02-05: 4 mg via ORAL
  Filled 2022-02-05: qty 1

## 2022-02-05 NOTE — ED Provider Notes (Signed)
[facility]-EMERGENCY DEPT Provider Note   CSN: [id number] Arrival date & time: 02/04/22  2354     History  Chief Complaint  Patient presents with   Dizziness    Melissa Lloyd is a 66 y.o. female.  66 year old female presents to the emergency department for evaluation of dizziness.  Symptoms began after she was shampooing her rug prior to arrival.  Describes her dizziness as a spinning sensation, though this was not aggravated by position change and has largely spontaneously resolved.  She does note some shortness of breath when her dizziness began; likens it to hyperventilating.  She does endorse having panic attacks in the past with both dizziness and SOB.  Denies chest pain, fever, vomiting, extremity numbness or weakness, syncope/near syncope.  The history is provided by the patient. No language interpreter was used.  Dizziness      Home Medications Prior to Admission medications   Medication Sig Start Date End Date Taking? Authorizing Provider  albuterol (VENTOLIN HFA) 108 (90 Base) MCG/ACT inhaler Inhale 2 puffs into the lungs every 4 (four) hours as needed for wheezing or shortness of breath. 08/20/21 11/18/21  Ganta, Anupa, DO  fluticasone (FLONASE) 50 MCG/ACT nasal spray Place 2 sprays into both nostrils daily. 08/20/21 11/18/21  Ganta, Anupa, DO  montelukast (SINGULAIR) 10 MG tablet Take 1 tablet (10 mg total) by mouth daily. 08/20/21   Ganta, Anupa, DO  valsartan-hydrochlorothiazide (DIOVAN-HCT) 80-12.5 MG tablet Take 1 tablet by mouth daily. 08/20/21   Reece Leader, DO      Allergies    Patient has no known allergies.    Review of Systems   Review of Systems  Neurological:  Positive for dizziness.  Ten systems reviewed and are negative for acute change, except as noted in the HPI.    Physical Exam Updated Vital Signs BP 123/67   Pulse 69   Temp 97.7 F (36.5 C) (Oral)   Resp 18   LMP 07/19/2011   SpO2 100%   Physical Exam Vitals and nursing  note reviewed.  Constitutional:      General: She is not in acute distress.    Appearance: She is well-developed. She is not diaphoretic.     Comments: Nontoxic appearing and in NAD  HENT:     Head: Normocephalic and atraumatic.     Right Ear: External ear normal.     Left Ear: External ear normal.  Eyes:     General: No scleral icterus.    Conjunctiva/sclera: Conjunctivae normal.  Pulmonary:     Effort: Pulmonary effort is normal. No respiratory distress.     Breath sounds: No stridor. No wheezing.     Comments: Respirations even and unlabored Musculoskeletal:        General: Normal range of motion.     Cervical back: Normal range of motion.  Skin:    General: Skin is warm and dry.     Coloration: Skin is not pale.     Findings: No erythema or rash.  Neurological:     Mental Status: She is alert and oriented to person, place, and time.     Coordination: Coordination normal.     Comments: GCS 15. Speech is goal oriented. No focal deficits appreciated. Patient moves extremities without ataxia. Patient ambulatory with steady gait.  Psychiatric:        Behavior: Behavior normal.     ED Results / Procedures / Treatments   Labs (all labs ordered are listed, but only abnormal results  are displayed) Labs Reviewed  CBG MONITORING, ED - Abnormal; Notable for the following components:      Result Value   Glucose-Capillary 159 (*)    All other components within normal limits    EKG EKG Interpretation  Date/Time:  Tuesday February 05 2022 00:25:58 EDT Ventricular Rate:  65 PR Interval:  149 QRS Duration: 111 QT Interval:  461 QTC Calculation: 480 R Axis:   78 Text Interpretation: Sinus rhythm Baseline wander No significant change since last tracing Confirmed by Melene Plan [phone]) on 02/05/2022 12:35:22 AM  Radiology No results found.  Procedures Procedures    Medications Ordered in ED Medications  ondansetron (ZOFRAN-ODT) disintegrating tablet 4 mg (4 mg Oral Given  02/05/22 0247)    ED Course/ Medical Decision Making/ A&P                           Medical Decision Making Risk Prescription drug management.   This patient presents to the ED for concern of dizziness, this involves an extensive number of treatment options, and is a complaint that carries with it a high risk of complications and morbidity.  The differential diagnosis includes BPPV vs central vertigo vs substance use d/o vs intoxication vs hypoglycemia vs hypotension vs arrhythmia    Co morbidities that complicate the patient evaluation  HTN   Additional history obtained:  Additional history obtained from daughter at bedside   Lab Tests:  I Ordered, and personally interpreted labs.  The pertinent results include:  CBG 159   Cardiac Monitoring:  The patient was maintained on a cardiac monitor.  I personally viewed and interpreted the cardiac monitored which showed an underlying rhythm of: NSR   Medicines ordered and prescription drug management:  I ordered medication including Zofran for nausea  Reevaluation of the patient after these medicines showed that the patient improved I have reviewed the patients home medicines and have made adjustments as needed   Test Considered:  Chem-8   Problem List / ED Course:  No focal deficits on exam Symptoms have spontaneously improved w/o intervention Patient tolerating PO after Zofran   Reevaluation:  After the interventions noted above, I reevaluated the patient and found that they have :improved   Social Determinants of Health:  Insured patient Good social support   Dispostion:  After consideration of the diagnostic results and the patients response to treatment, I feel that the patent would benefit from outpatient PCP follow up. Return precautions discussed and provided. Patient discharged in stable condition with no unaddressed concerns.         Final Clinical Impression(s) / ED Diagnoses Final  diagnoses:  Episode of dizziness    Rx / DC Orders ED Discharge Orders     None         Antony Madura, PA-C 02/05/22 0333    Melene Plan, DO 02/05/22 [phone]

## 2022-02-07 ENCOUNTER — Other Ambulatory Visit: Payer: Self-pay

## 2022-02-07 DIAGNOSIS — J309 Allergic rhinitis, unspecified: Secondary | ICD-10-CM

## 2022-02-07 DIAGNOSIS — I10 Essential (primary) hypertension: Secondary | ICD-10-CM

## 2022-02-08 MED ORDER — VALSARTAN-HYDROCHLOROTHIAZIDE 80-12.5 MG PO TABS: 1.0000 | ORAL_TABLET | Freq: Every day | ORAL | 0 refills | Status: DC

## 2022-02-08 MED ORDER — MONTELUKAST SODIUM 10 MG PO TABS: 10.0000 mg | ORAL_TABLET | Freq: Every day | ORAL | 0 refills | Status: DC

## 2022-02-27 ENCOUNTER — Encounter: Payer: Self-pay | Admitting: Family Medicine

## 2022-02-27 ENCOUNTER — Ambulatory Visit (INDEPENDENT_AMBULATORY_CARE_PROVIDER_SITE_OTHER): Admitting: Family Medicine

## 2022-02-27 VITALS — BP 120/60 | HR 64 | Ht 61.0 in | Wt 142.0 lb

## 2022-02-27 DIAGNOSIS — Z0000 Encounter for general adult medical examination without abnormal findings: Secondary | ICD-10-CM | POA: Diagnosis not present

## 2022-02-27 DIAGNOSIS — I10 Essential (primary) hypertension: Secondary | ICD-10-CM

## 2022-02-27 DIAGNOSIS — J309 Allergic rhinitis, unspecified: Secondary | ICD-10-CM

## 2022-02-27 DIAGNOSIS — Z1231 Encounter for screening mammogram for malignant neoplasm of breast: Secondary | ICD-10-CM

## 2022-02-27 DIAGNOSIS — Z13220 Encounter for screening for lipoid disorders: Secondary | ICD-10-CM | POA: Diagnosis not present

## 2022-02-27 DIAGNOSIS — Z113 Encounter for screening for infections with a predominantly sexual mode of transmission: Secondary | ICD-10-CM | POA: Diagnosis not present

## 2022-02-27 DIAGNOSIS — Z1211 Encounter for screening for malignant neoplasm of colon: Secondary | ICD-10-CM

## 2022-02-27 MED ORDER — ZOSTER VAC RECOMB ADJUVANTED 50 MCG/0.5ML IM SUSR: 0.5000 mL | Freq: Once | INTRAMUSCULAR | 0 refills | Status: AC

## 2022-02-27 MED ORDER — ALBUTEROL SULFATE HFA 108 (90 BASE) MCG/ACT IN AERS: 2.0000 | INHALATION_SPRAY | RESPIRATORY_TRACT | 3 refills | Status: DC | PRN

## 2022-02-27 MED ORDER — VALSARTAN-HYDROCHLOROTHIAZIDE 80-12.5 MG PO TABS: 1.0000 | ORAL_TABLET | Freq: Every day | ORAL | 0 refills | Status: DC

## 2022-02-27 MED ORDER — FLUTICASONE PROPIONATE 50 MCG/ACT NA SUSP: 2.0000 | Freq: Every day | NASAL | 0 refills | Status: DC

## 2022-02-27 MED ORDER — MONTELUKAST SODIUM 10 MG PO TABS: 10.0000 mg | ORAL_TABLET | Freq: Every day | ORAL | 0 refills | Status: DC

## 2022-02-27 NOTE — Patient Instructions (Addendum)
It was great to see you!  Please schedule your mammogram. Call [facility] at [phone] to schedule an appointment. They are located at [place] your shingles shot at your pharmacy. If it's too expensive, don't worry, we will do it when you're 67 instead.  We will send you a colon cancer screening kit in the mail (stool test).  I have sent refills on all your medications.  Complete the blue advanced directives packet and return to our office. We have a notary here.  We are checking some labs, I will send you a letter with the results or call if they're abnormal.  Take care, Dr Rock Nephew
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
## 2022-02-28 ENCOUNTER — Encounter: Payer: Self-pay | Admitting: Family Medicine

## 2022-02-28 LAB — LIPID PANEL
Chol/HDL Ratio: 3.5 ratio (ref 0.0–4.4)
Cholesterol, Total: 153 mg/dL (ref 100–199)
HDL: 44 mg/dL (ref >39)
LDL Chol Calc (NIH): 93 mg/dL (ref 0–99)
Triglycerides: 84 mg/dL (ref 0–149)
VLDL Cholesterol Cal: 16 mg/dL (ref 5–40)

## 2022-02-28 LAB — BASIC METABOLIC PANEL
BUN/Creatinine Ratio: 9 — ABNORMAL LOW (ref 12–28)
BUN: 7 mg/dL — ABNORMAL LOW (ref 8–27)
CO2: 22 mmol/L (ref 20–29)
Calcium: 10.2 mg/dL (ref 8.7–10.3)
Chloride: 94 mmol/L — ABNORMAL LOW (ref 96–106)
Creatinine, Ser: 0.8 mg/dL (ref 0.57–1.00)
Glucose: 119 mg/dL — ABNORMAL HIGH (ref 70–99)
Potassium: 4 mmol/L (ref 3.5–5.2)
Sodium: 135 mmol/L (ref 134–144)
eGFR: 82 mL/min/{1.73_m2} (ref >59)

## 2022-02-28 LAB — HIV ANTIBODY (ROUTINE TESTING W REFLEX): HIV Screen 4th Generation wRfx: NONREACTIVE

## 2022-02-28 LAB — RPR: RPR Ser Ql: NONREACTIVE

## 2022-02-28 NOTE — Assessment & Plan Note (Signed)
Well-controlled. Continue current medications (valsartan-HCTZ 80-12.5mg  daily). Refills sent. Check updated BMP today.

## 2022-03-01 ENCOUNTER — Encounter: Payer: Self-pay | Admitting: Family Medicine

## 2022-03-14 ENCOUNTER — Other Ambulatory Visit: Payer: Self-pay | Admitting: Family Medicine

## 2022-03-14 DIAGNOSIS — J309 Allergic rhinitis, unspecified: Secondary | ICD-10-CM

## 2022-03-28 ENCOUNTER — Ambulatory Visit

## 2022-04-17 ENCOUNTER — Ambulatory Visit: Admitting: Family Medicine

## 2022-04-17 DIAGNOSIS — Z23 Encounter for immunization: Secondary | ICD-10-CM

## 2022-04-23 ENCOUNTER — Other Ambulatory Visit: Payer: Self-pay

## 2022-04-23 ENCOUNTER — Ambulatory Visit (INDEPENDENT_AMBULATORY_CARE_PROVIDER_SITE_OTHER)

## 2022-04-23 VITALS — Temp 98.2°F

## 2022-04-23 DIAGNOSIS — J309 Allergic rhinitis, unspecified: Secondary | ICD-10-CM

## 2022-04-23 MED ORDER — ALBUTEROL SULFATE HFA 108 (90 BASE) MCG/ACT IN AERS: 2.0000 | INHALATION_SPRAY | RESPIRATORY_TRACT | 3 refills | Status: DC | PRN

## 2022-04-23 NOTE — Telephone Encounter (Signed)
Patient calls nurse line requesting refills on albuterol inhaler.   Patient requests #3 inhalers at a time. She reports using a mail order pharmacy and they prefer a "larger" month supply vs sending a refill every month.   Will forward to PCP.

## 2022-04-24 ENCOUNTER — Ambulatory Visit
Admission: RE | Admit: 2022-04-24 | Discharge: 2022-04-24 | Disposition: A | Discharge status: Home / Self Care | Source: Ambulatory Visit | Attending: Family Medicine | Admitting: Family Medicine

## 2022-04-24 DIAGNOSIS — Z1231 Encounter for screening mammogram for malignant neoplasm of breast: Secondary | ICD-10-CM

## 2022-04-25 NOTE — Progress Notes (Signed)
Documented in wrong encounter.  Flu shot given on 04/17/22 per Jake Seats, CMA

## 2022-06-12 ENCOUNTER — Other Ambulatory Visit: Payer: Self-pay | Admitting: Family Medicine

## 2022-06-12 DIAGNOSIS — I10 Essential (primary) hypertension: Secondary | ICD-10-CM

## 2022-06-12 DIAGNOSIS — J309 Allergic rhinitis, unspecified: Secondary | ICD-10-CM

## 2022-07-24 ENCOUNTER — Other Ambulatory Visit: Payer: Self-pay

## 2022-07-24 DIAGNOSIS — J309 Allergic rhinitis, unspecified: Secondary | ICD-10-CM

## 2022-07-24 DIAGNOSIS — I10 Essential (primary) hypertension: Secondary | ICD-10-CM

## 2022-07-24 MED ORDER — FLUTICASONE PROPIONATE 50 MCG/ACT NA SUSP: NASAL | 0 refills | Status: DC

## 2022-07-24 MED ORDER — ALBUTEROL SULFATE HFA 108 (90 BASE) MCG/ACT IN AERS: 2.0000 | INHALATION_SPRAY | RESPIRATORY_TRACT | 0 refills | Status: DC | PRN

## 2022-07-24 MED ORDER — VALSARTAN-HYDROCHLOROTHIAZIDE 80-12.5 MG PO TABS: 1.0000 | ORAL_TABLET | Freq: Every day | ORAL | 0 refills | Status: DC

## 2022-07-24 MED ORDER — MONTELUKAST SODIUM 10 MG PO TABS: 10.0000 mg | ORAL_TABLET | Freq: Every day | ORAL | 0 refills | Status: DC

## 2022-07-24 NOTE — Telephone Encounter (Signed)
Patient calls nurse line requesting 30 day supply of medications be sent to local [facility].   She reports that she is in the process of submitting paperwork to [employer] so that her medications will be covered under Medicare.   In the meantime, she needs prescriptions at local pharmacy.   Medications pended to [facility].   Talbot Grumbling, RN

## 2022-08-08 ENCOUNTER — Encounter: Payer: Self-pay | Admitting: Family Medicine

## 2022-08-08 ENCOUNTER — Ambulatory Visit (INDEPENDENT_AMBULATORY_CARE_PROVIDER_SITE_OTHER): Payer: Medicare Other | Admitting: Family Medicine

## 2022-08-08 VITALS — BP 142/92 | HR 69 | Wt 149.8 lb

## 2022-08-08 DIAGNOSIS — T7421XA Adult sexual abuse, confirmed, initial encounter: Secondary | ICD-10-CM
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
## 2022-08-09 ENCOUNTER — Telehealth: Payer: Self-pay | Admitting: Family Medicine

## 2022-08-09 DIAGNOSIS — T7421XA Adult sexual abuse, confirmed, initial encounter: Secondary | ICD-10-CM | POA: Insufficient documentation

## 2022-08-09 LAB — COMPREHENSIVE METABOLIC PANEL
ALT: 18 IU/L (ref 0–32)
AST: 21 IU/L (ref 0–40)
Albumin/Globulin Ratio: 1.7 (ref 1.2–2.2)
Albumin: 4.7 g/dL (ref 3.9–4.9)
Alkaline Phosphatase: 95 IU/L (ref 44–121)
BUN/Creatinine Ratio: 12 (ref 12–28)
BUN: 12 mg/dL (ref 8–27)
Bilirubin Total: 0.5 mg/dL (ref 0.0–1.2)
CO2: 24 mmol/L (ref 20–29)
Calcium: 9.8 mg/dL (ref 8.7–10.3)
Chloride: 100 mmol/L (ref 96–106)
Creatinine, Ser: 1.01 mg/dL — ABNORMAL HIGH (ref 0.57–1.00)
Globulin, Total: 2.7 g/dL (ref 1.5–4.5)
Glucose: 100 mg/dL — ABNORMAL HIGH (ref 70–99)
Potassium: 3.6 mmol/L (ref 3.5–5.2)
Sodium: 141 mmol/L (ref 134–144)
Total Protein: 7.4 g/dL (ref 6.0–8.5)
eGFR: 62 mL/min/{1.73_m2} (ref >59)

## 2022-08-09 LAB — RPR: RPR Ser Ql: NONREACTIVE

## 2022-08-09 LAB — HIV ANTIBODY (ROUTINE TESTING W REFLEX): HIV Screen 4th Generation wRfx: NONREACTIVE

## 2022-08-09 LAB — HCV INTERPRETATION

## 2022-08-09 LAB — HEPATITIS B SURFACE ANTIBODY,QUALITATIVE: Hep B Surface Ab, Qual: REACTIVE

## 2022-08-09 LAB — HEPATITIS B CORE AB W/REFLEX: Hep B Core Total Ab: NEGATIVE

## 2022-08-09 LAB — HCV AB W REFLEX TO QUANT PCR: HCV Ab: NONREACTIVE

## 2022-08-09 LAB — HEPATITIS B SURFACE ANTIGEN: Hepatitis B Surface Ag: NEGATIVE

## 2022-08-09 MED ORDER — DOLUTEGRAVIR SODIUM 50 MG PO TABS: 50.0000 mg | ORAL_TABLET | Freq: Every day | ORAL | 0 refills | Status: DC

## 2022-08-09 MED ORDER — EMTRICITABINE-TENOFOVIR DF 200-300 MG PO TABS: 1.0000 | ORAL_TABLET | Freq: Every day | ORAL | 0 refills | Status: DC
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
## 2022-08-09 NOTE — Addendum Note (Signed)
Addended by: Alcus Dad on: 08/09/2022 05:26 PM   Modules accepted: Orders

## 2022-08-09 NOTE — Telephone Encounter (Signed)
Called patient to discuss lab results. Negative HIV, RPR, Hep C and Hep B.  Rx sent for HIV post-exposure prophylaxis (28 day course). Advised patient to pick up tonight and start right away. She is agreeable as long as it's not cost-prohibitive.   Will plan to check for gonorrhea/chlamydia/trich at follow up visit in approx 1-2 weeks. Will also need HIV re-testing in 6 weeks (April 2nd), 3 months (end of May) and 6 months (end of August) and RPR re-testing at 6 weeks (April 2nd) and 3 months (end of May).   Alcus Dad, MD PGY-3, [place]

## 2022-08-10 ENCOUNTER — Other Ambulatory Visit: Payer: Self-pay | Admitting: Family Medicine

## 2022-08-14 ENCOUNTER — Telehealth: Payer: Self-pay | Admitting: Student

## 2022-08-14 NOTE — Telephone Encounter (Signed)
**  After Hours/ Emergency Line Call**  Received a page to call [phone]) [phone].  Patient: Melissa Lloyd  Caller: Self  Confirmed name & DOB of patient with caller  Subjective:  Having allergy problems, and appreciates that her asthma has been worse at night, with wheezing and mucous. Sometimes having chest tightness. Appreciates she doesn't have her albuterol or Flonase. Is also hoping to get albuterol pumps and machine. Reports that since she's turned 67 her meds haven't been covered. Patient plans to see Dr. Rock Nephew 08/22/22. Patient insistent on getting her albuterol.   Objective:  Observations: NAD    Assessment & Plan  Melissa Lloyd is a 67 y.o. female with PMHx concerning for asthma. Patient calls nurse line requesting refill of albuterol and for a nebulizer. Patient reporting significant night time symptoms w/ wheezing, chest tightness and SOB. Patient also appreciates day time symptoms of cough and wheezing if she goes outside. Patient has been w/o medications for a while and appears to be in an asthma flare, she states this happens when the spring starts to come in. Unsure of patients respiratory status and night time symptoms are concerning. Instructed patient to go to UC or ED to be seen/evaluated as I cannot refill her albuterol w/o knowing her respiratory status, and if patient needs more care. Patient report's she doesn't have transportation and does feel she is bad enough to go to ED. Patient plans on calling clinic tomorrow to get ahold of Dr. Rock Nephew.    Recommendations:  Go to UC or ED to be evaluated for asthma exacerbation Patient wanting to call clinic tomorrow for Rx instead  Discussed red flags of worsening SOB, Cough, chest tightness to call EMS or go to ED.  -- Will forward to PCP.  Holley Bouche, MD [place] [facility], PGY-2

## 2022-08-15 ENCOUNTER — Other Ambulatory Visit: Payer: Self-pay | Admitting: Family Medicine

## 2022-08-15 ENCOUNTER — Other Ambulatory Visit: Payer: Self-pay

## 2022-08-15 DIAGNOSIS — J309 Allergic rhinitis, unspecified: Secondary | ICD-10-CM

## 2022-08-15 DIAGNOSIS — I10 Essential (primary) hypertension: Secondary | ICD-10-CM

## 2022-08-15 MED ORDER — FLUTICASONE PROPIONATE 50 MCG/ACT NA SUSP: NASAL | 0 refills | Status: DC

## 2022-08-15 MED ORDER — VALSARTAN-HYDROCHLOROTHIAZIDE 80-12.5 MG PO TABS: 1.0000 | ORAL_TABLET | Freq: Every day | ORAL | 0 refills | Status: DC

## 2022-08-15 MED ORDER — MONTELUKAST SODIUM 10 MG PO TABS: 10.0000 mg | ORAL_TABLET | Freq: Every day | ORAL | 0 refills | Status: DC

## 2022-08-15 MED ORDER — ALBUTEROL SULFATE HFA 108 (90 BASE) MCG/ACT IN AERS: 2.0000 | INHALATION_SPRAY | RESPIRATORY_TRACT | 0 refills | Status: DC | PRN

## 2022-08-15 NOTE — Addendum Note (Signed)
Addended by: Dorna Bloom on: 08/15/2022 03:27 PM   Modules accepted: Orders

## 2022-08-15 NOTE — Addendum Note (Signed)
Addended by: Dorna Bloom on: 08/15/2022 03:31 PM   Modules accepted: Orders

## 2022-08-15 NOTE — Addendum Note (Signed)
Addended by: Dorna Bloom on: 08/15/2022 03:42 PM   Modules accepted: Orders

## 2022-08-15 NOTE — Telephone Encounter (Signed)
Patient is needing refills on ALL medications sent to [facility].   Please Advise.  Thanks! [place]

## 2022-08-20 ENCOUNTER — Other Ambulatory Visit: Payer: Self-pay | Admitting: Family Medicine

## 2022-08-20 DIAGNOSIS — J309 Allergic rhinitis, unspecified: Secondary | ICD-10-CM

## 2022-08-22 ENCOUNTER — Other Ambulatory Visit (HOSPITAL_COMMUNITY)
Admission: RE | Admit: 2022-08-22 | Discharge: 2022-08-22 | Disposition: A | Discharge status: Home / Self Care | Payer: Medicare PPO | Source: Ambulatory Visit | Attending: Family Medicine | Admitting: Family Medicine

## 2022-08-22 ENCOUNTER — Ambulatory Visit (INDEPENDENT_AMBULATORY_CARE_PROVIDER_SITE_OTHER): Payer: Medicare PPO | Admitting: Family Medicine

## 2022-08-22 ENCOUNTER — Encounter: Payer: Self-pay | Admitting: Family Medicine

## 2022-08-22 VITALS — BP 135/71 | HR 73 | Ht 61.0 in | Wt 151.6 lb

## 2022-08-22 DIAGNOSIS — J45909 Unspecified asthma, uncomplicated: Secondary | ICD-10-CM

## 2022-08-22 DIAGNOSIS — T7421XD Adult sexual abuse, confirmed, subsequent encounter: Secondary | ICD-10-CM | POA: Diagnosis not present

## 2022-08-22 DIAGNOSIS — I10 Essential (primary) hypertension: Secondary | ICD-10-CM

## 2022-08-22 DIAGNOSIS — J309 Allergic rhinitis, unspecified: Secondary | ICD-10-CM

## 2022-08-22 DIAGNOSIS — T7421XA Adult sexual abuse, confirmed, initial encounter: Secondary | ICD-10-CM | POA: Diagnosis not present

## 2022-08-22 NOTE — Patient Instructions (Addendum)
It was great to see you!  I will send refills on all of your medications.  Today we checked for sexually transmitted infections including gonorrhea, chlamydia, trichomonas.  I will send you a letter with the results or call if they are abnormal.  I would like you to have formal lung testing so that we can manage your inhalers/breathing symptoms better. We do this in our office with Dr. Valentina Lucks-- you can schedule on your way out.  Return in 4 weeks for repeat blood work (retesting for HIV and syphilis).   Take care and seek immediate care sooner if you develop any concerns.  Dr. [facility]
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
## 2022-08-23 DIAGNOSIS — J45909 Unspecified asthma, uncomplicated: Secondary | ICD-10-CM | POA: Insufficient documentation

## 2022-08-23 LAB — CERVICOVAGINAL ANCILLARY ONLY
Chlamydia: NEGATIVE
Comment: NEGATIVE
Comment: NEGATIVE
Comment: NORMAL
Neisseria Gonorrhea: NEGATIVE
Trichomonas: NEGATIVE

## 2022-08-23 MED ORDER — ALBUTEROL SULFATE HFA 108 (90 BASE) MCG/ACT IN AERS: 2.0000 | INHALATION_SPRAY | RESPIRATORY_TRACT | 0 refills | Status: DC | PRN

## 2022-08-23 MED ORDER — VALSARTAN-HYDROCHLOROTHIAZIDE 80-12.5 MG PO TABS: 1.0000 | ORAL_TABLET | Freq: Every day | ORAL | 0 refills | Status: DC

## 2022-08-23 MED ORDER — FLUTICASONE PROPIONATE 50 MCG/ACT NA SUSP: 2.0000 | Freq: Every day | NASAL | 1 refills | Status: DC

## 2022-08-23 MED ORDER — MONTELUKAST SODIUM 10 MG PO TABS: 10.0000 mg | ORAL_TABLET | Freq: Every day | ORAL | 0 refills | Status: DC
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
## 2022-08-23 NOTE — Assessment & Plan Note (Addendum)
Self-reported history of asthma although this is not corroborated in her chart. Do not feel nebulizer is appropriate at this time as symptoms more consistent with post-nasal drip than asthma. Continue flonase and singulair. Will refill albuterol inhaler for now and patient to have PFTs with Dr Valentina Lucks for further evaluation.

## 2022-08-24 ENCOUNTER — Encounter: Payer: Self-pay | Admitting: Family Medicine

## 2022-09-27 ENCOUNTER — Telehealth: Payer: Self-pay | Admitting: Family Medicine

## 2022-09-27 ENCOUNTER — Other Ambulatory Visit: Payer: Self-pay | Admitting: Family Medicine

## 2022-09-27 DIAGNOSIS — T7421XD Adult sexual abuse, confirmed, subsequent encounter: Secondary | ICD-10-CM

## 2022-09-27 NOTE — Telephone Encounter (Signed)
Contacted Melissa Lloyd to schedule their annual wellness visit. Welcome to Medicare visit Due by 06/18/2023.  Thank you,  [facility] Support [facility] Direct dial  [phone]

## 2022-10-02 ENCOUNTER — Ambulatory Visit: Payer: Medicare Other | Admitting: Pharmacist

## 2022-10-02 ENCOUNTER — Other Ambulatory Visit: Payer: Medicare Other

## 2022-11-04 ENCOUNTER — Other Ambulatory Visit: Payer: Self-pay | Admitting: Family Medicine

## 2022-11-04 DIAGNOSIS — J309 Allergic rhinitis, unspecified: Secondary | ICD-10-CM

## 2022-11-04 DIAGNOSIS — I10 Essential (primary) hypertension: Secondary | ICD-10-CM

## 2022-11-17 ENCOUNTER — Encounter (HOSPITAL_COMMUNITY): Payer: Self-pay

## 2022-11-17 ENCOUNTER — Emergency Department (HOSPITAL_COMMUNITY): Payer: Medicare PPO

## 2022-11-17 ENCOUNTER — Emergency Department (HOSPITAL_COMMUNITY)
Admission: EM | Admit: 2022-11-17 | Discharge: 2022-11-17 | Disposition: A | Discharge status: Home / Self Care | Payer: Medicare PPO | Attending: Emergency Medicine | Admitting: Emergency Medicine

## 2022-11-17 DIAGNOSIS — S93401A Sprain of unspecified ligament of right ankle, initial encounter: Secondary | ICD-10-CM | POA: Insufficient documentation

## 2022-11-17 DIAGNOSIS — W07XXXA Fall from chair, initial encounter: Secondary | ICD-10-CM | POA: Insufficient documentation

## 2022-11-17 DIAGNOSIS — M25571 Pain in right ankle and joints of right foot: Secondary | ICD-10-CM | POA: Diagnosis present

## 2022-11-17 DIAGNOSIS — I10 Essential (primary) hypertension: Secondary | ICD-10-CM | POA: Insufficient documentation

## 2022-11-17 NOTE — Discharge Instructions (Signed)
Your x-rays without any evidence of fracture.  I have given you the information for an orthopedic office to follow-up with if you are still having ankle pain and 4 weeks otherwise however I recommend ice elevation Tylenol ibuprofen and wearing the ankle brace.  Use crutches as long as needed but I recommend that you transition off of crutches as soon as you are able to.  Please use Tylenol or ibuprofen for pain.  You may use 600 mg ibuprofen every 6 hours or 1000 mg of Tylenol every 6 hours.  You may choose to alternate between the 2.  This would be most effective.  Not to exceed 4 g of Tylenol within 24 hours.  Not to exceed 3200 mg ibuprofen 24 hours.

## 2022-11-17 NOTE — ED Triage Notes (Signed)
Pt arrived via POV, c/o right ankle pain. States that she fell in chair and pain to right ankle since.

## 2022-11-17 NOTE — ED Provider Notes (Signed)
[facility] EMERGENCY DEPARTMENT AT [facility] Provider Note   CSN: [id number] Arrival date & time: 11/17/22  1359     History  Chief Complaint  Patient presents with   Ankle Pain    EVANGLINE Lloyd is a 67 y.o. female.   Ankle Pain Patient is a 67 year old female with a past medical history significant for hypertension, arthritis  She is present emergency room today with complaints of right ankle pain after she fell out of a chair as it fell apart this fall occurred 1 week ago.  She states she has not had pain in her whole right leg however this is all improved except for her right ankle which still seems to be bothering her she states it was improving and then began hurting again.  She denies any head injury or loss of consciousness nausea vomiting confusion.  She denies any back pain or neck pain.  She has not taken any medications today for her pain.  She has been walking but has been limping some.     Home Medications Prior to Admission medications   Medication Sig Start Date End Date Taking? Authorizing Provider  bacitracin-polymyxin b (POLYSPORIN) ophthalmic ointment APPLY ONE APPLICATION INTO THE LEFT EYE EVERY 12 HOURS WHILE AWAKE 06/13/22   Maury Dus, MD  DIOVAN HCT 80-12.5 MG tablet TAKE ONE TABLET BY MOUTH EVERY DAY 11/04/22   Maury Dus, MD  fluticasone (FLONASE) 50 MCG/ACT nasal spray Place 2 sprays into both nostrils daily. 08/23/22   Maury Dus, MD  montelukast (SINGULAIR) 10 MG tablet TAKE ONE TABLET BY MOUTH EVERY DAY 11/04/22   Maury Dus, MD  PROAIR HFA 108 [phone] Base) MCG/ACT inhaler INHALE TWO PUFFS BY MOUTH EVERY 4 HOURS AS NEEDED FOR WHEEZING OR SHORTNESS OF BREATH 11/04/22   Maury Dus, MD      Allergies    Patient has no known allergies.    Review of Systems   Review of Systems  Physical Exam Updated Vital Signs BP 119/62   Pulse 90   Temp 98.4 F (36.9 C) (Oral)   Resp 18   LMP 07/19/2011   SpO2 99%  Physical  Exam Vitals and nursing note reviewed.  Constitutional:      General: She is not in acute distress.    Appearance: Normal appearance. She is not ill-appearing.  HENT:     Head: Normocephalic and atraumatic.  Eyes:     General: No scleral icterus.       Right eye: No discharge.        Left eye: No discharge.     Conjunctiva/sclera: Conjunctivae normal.  Pulmonary:     Effort: Pulmonary effort is normal.     Breath sounds: No stridor.  Musculoskeletal:     Comments: Tenderness of the posterior and lateral right malleolus  Full range of motion of ankle able to wiggle toes, DP PT pulses 3+ and symmetric, sensation intact in all toes, no proximal fibular or knee tenderness.  No hip tenderness.  Skin:    General: Skin is warm and dry.  Neurological:     Mental Status: She is alert and oriented to person, place, and time. Mental status is at baseline.     ED Results / Procedures / Treatments   Labs (all labs ordered are listed, but only abnormal results are displayed) Labs Reviewed - No data to display  EKG None  Radiology DG Ankle Complete Right  Result Date: 11/17/2022 CLINICAL DATA:  Fall with ankle  pain EXAM: RIGHT ANKLE - COMPLETE 3+ VIEW COMPARISON:  Right ankle x-ray 02/12/2015 FINDINGS: There is no evidence for acute fracture or dislocation. There is lateral soft tissue swelling. Joint spaces are well maintained. Plantar and posterior calcaneal spurs are present. IMPRESSION: 1. No acute fracture or dislocation. 2. Lateral soft tissue swelling. Electronically Signed   By: Darliss Cheney M.D.   On: 11/17/2022 17:03    Procedures Procedures    Medications Ordered in ED Medications - No data to display  ED Course/ Medical Decision Making/ A&P                             Medical Decision Making Amount and/or Complexity of Data Reviewed Radiology: ordered.   Patient is a 67 year old female with a past medical history significant for hypertension, arthritis  She is  present emergency room today with complaints of right ankle pain after she fell out of a chair as it fell apart this fall occurred 1 week ago.  She states she has not had pain in her whole right leg however this is all improved except for her right ankle which still seems to be bothering her she states it was improving and then began hurting again.  She denies any head injury or loss of consciousness nausea vomiting confusion.  She denies any back pain or neck pain.  She has not taken any medications today for her pain.  She has been walking but has been limping some.  X-ray without fracture.  Exam and symptoms consistent with ATFL sprain.  Recommend ASO brace and crutches  Follow-up with orthopedics.  Return precautions discussed  Final Clinical Impression(s) / ED Diagnoses Final diagnoses:  Sprain of right ankle, unspecified ligament, initial encounter    Rx / DC Orders ED Discharge Orders     None         Gailen Shelter, [place] 11/17/22 1857    Terald Sleeper, MD 11/17/22 2248

## 2022-11-17 NOTE — ED Notes (Signed)
Pt given soda and turkey sandwich  

## 2022-11-21 ENCOUNTER — Telehealth: Payer: Self-pay | Admitting: *Deleted

## 2022-11-21 NOTE — Telephone Encounter (Signed)
Transition Care Management Unsuccessful Follow-up Telephone Call  Date of discharge and from where:  [facility] 11/17/2022  Attempts:  1st Attempt  Reason for unsuccessful TCM follow-up call:  Unable to reach patient   NUMBER NOT INSERVICE

## 2022-11-26 ENCOUNTER — Ambulatory Visit (INDEPENDENT_AMBULATORY_CARE_PROVIDER_SITE_OTHER): Payer: Medicare PPO | Admitting: Family Medicine

## 2022-11-26 ENCOUNTER — Telehealth: Payer: Self-pay

## 2022-11-26 VITALS — BP 128/72 | HR 83 | Wt 147.6 lb

## 2022-11-26 DIAGNOSIS — S93401D Sprain of unspecified ligament of right ankle, subsequent encounter: Secondary | ICD-10-CM

## 2022-11-26 MED ORDER — DICLOFENAC SODIUM 1 % EX GEL: 4.0000 g | Freq: Four times a day (QID) | CUTANEOUS | 0 refills | Status: DC

## 2022-11-26 NOTE — Telephone Encounter (Signed)
Letter written. Routed to admin team to mail to patient.    Maury Dus, MD PGY-3, [facility]

## 2022-11-26 NOTE — Telephone Encounter (Signed)
Patient calls nurse line requesting letter from Dr. Anner Crete regarding Tivicay prescription.   She states that [facility] needs documentation that she is not receiving treatment due to documented diagnosis of HIV and that this was just a temporary medication.   Patient requests that once completed, letter be mailed to her house.   Will forward request to PCP.   Veronda Prude, RN

## 2022-11-26 NOTE — Patient Instructions (Addendum)
It was nice seeing you today!  I have placed a referral to an orthopedic doctor. Try to do ankle exercises at home a few times a day.  Stay well, Littie Deeds, MD [facility] [phone]  --  Make sure to check out at the front desk before you leave today.  Please arrive at least 15 minutes prior to your scheduled appointments.  If you had blood work today, I will send you a MyChart message or a letter if results are normal. Otherwise, I will give you a call.  If you had a referral placed, they will call you to set up an appointment. Please give [place] a call if you don't hear back in the next 2 weeks.  If you need additional refills before your next appointment, please call your pharmacy first.

## 2022-11-26 NOTE — Progress Notes (Signed)
    SUBJECTIVE:   CHIEF COMPLAINT / HPI:    Patient seen in the ED on 6/2 for ankle sprain, right sided.  At that time, patient had reported falling out of a chair 1 week prior to that visit.  XR obtained without fracture.  Felt to have ATFL sprain.  Given follow-up information for orthopedics.  Patient has been using her daughter's Voltaren gel which has been helping. Would like a prescription for this. She reports that the ankle is feeling better but is still having some pain.  She would like a referral to see orthopedics.  She was unaware she was given follow-up information for orthopedic provider. She has been wearing the ASO brace during the day. She is still having some back discomfort ever since the fall which is slowly getting better. She has been taking Tylenol and ibuprofen which she does not think is helping very much.  PERTINENT  PMH / PSH: OA, HTN  Patient Care Team: Maury Dus, MD as PCP - General (Family Medicine)   OBJECTIVE:   BP 128/72   Pulse 83   Wt 147 lb 9.6 oz (67 kg)   LMP 07/19/2011   SpO2 98%   BMI 27.89 kg/m   Physical Exam Constitutional:      General: She is not in acute distress. Cardiovascular:     Rate and Rhythm: Normal rate.  Pulmonary:     Effort: Pulmonary effort is normal. No respiratory distress.  Musculoskeletal:     Cervical back: Neck supple.     Comments: Right ankle with small amount of swelling.  There is tenderness along the lateral aspect of the ankle inferior to the lateral malleolus.  No tenderness along the lateral or medial malleolus.  Full ROM with dorsiflexion and plantarflexion.  Full strength with resisted dorsiflexion and plantarflexion but there is pain elicited with plantarflexion.  2+ PT and DP pulses.  Neurological:     Mental Status: She is alert.         08/22/2022    1:47 PM  Depression screen PHQ 2/9  Decreased Interest 2  Down, Depressed, Hopeless 0  PHQ - 2 Score 2  Altered sleeping 0  Tired,  decreased energy 0  Change in appetite 0  Feeling bad or failure about yourself  0  Trouble concentrating 1  Moving slowly or fidgety/restless 0  Suicidal thoughts 0  PHQ-9 Score 3     {Show previous vital signs (optional):23777}    ASSESSMENT/PLAN:   1. Sprain of right ankle, unspecified ligament, subsequent encounter Patient seems to be slowly improving after a ankle sprain 2 to 3 weeks prior, but still having some pain.  I will prescribe her Voltaren gel as requested.  Will refer to orthopedics as requested, and I have provided her with some ankle rehab exercises which she may begin. - Ambulatory referral to Orthopedic Surgery - diclofenac Sodium (VOLTAREN) 1 % GEL; Apply 4 g topically 4 (four) times daily.  Dispense: 350 g; Refill: 0    Return if symptoms worsen or fail to improve.   Littie Deeds, MD [facility]

## 2022-12-04 ENCOUNTER — Ambulatory Visit: Payer: Medicare PPO | Admitting: Physician Assistant

## 2022-12-26 ENCOUNTER — Encounter: Payer: Self-pay | Admitting: Physician Assistant

## 2022-12-26 ENCOUNTER — Ambulatory Visit (INDEPENDENT_AMBULATORY_CARE_PROVIDER_SITE_OTHER): Payer: Medicare PPO | Admitting: Physician Assistant

## 2022-12-26 DIAGNOSIS — M25571 Pain in right ankle and joints of right foot: Secondary | ICD-10-CM | POA: Diagnosis not present

## 2022-12-26 DIAGNOSIS — S93401A Sprain of unspecified ligament of right ankle, initial encounter: Secondary | ICD-10-CM
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
## 2023-01-06 ENCOUNTER — Encounter: Payer: Self-pay | Admitting: Student

## 2023-01-06 ENCOUNTER — Other Ambulatory Visit (HOSPITAL_COMMUNITY)
Admission: RE | Admit: 2023-01-06 | Discharge: 2023-01-06 | Disposition: A | Discharge status: Home / Self Care | Payer: Medicare PPO | Source: Ambulatory Visit | Attending: Family Medicine | Admitting: Family Medicine

## 2023-01-06 ENCOUNTER — Ambulatory Visit: Payer: Medicare PPO | Admitting: Student

## 2023-01-06 VITALS — BP 131/87 | HR 95 | Ht 61.0 in | Wt 146.4 lb

## 2023-01-06 DIAGNOSIS — N898 Other specified noninflammatory disorders of vagina: Secondary | ICD-10-CM | POA: Diagnosis not present

## 2023-01-06 DIAGNOSIS — A599 Trichomoniasis, unspecified: Secondary | ICD-10-CM

## 2023-01-06 LAB — POCT WET PREP (WET MOUNT)
Clue Cells Wet Prep Whiff POC: NEGATIVE
WBC, Wet Prep HPF POC: >20

## 2023-01-06 MED ORDER — METRONIDAZOLE 500 MG PO TABS
500.0000 mg | ORAL_TABLET | Freq: Two times a day (BID) | ORAL | 0 refills | Status: DC
Start: 2023-01-06 — End: 2023-01-07

## 2023-01-06 MED ORDER — FLUCONAZOLE 150 MG PO TABS
150.0000 mg | ORAL_TABLET | Freq: Once | ORAL | 0 refills | Status: AC
Start: 2023-01-06 — End: 2023-01-06
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
## 2023-01-06 NOTE — Patient Instructions (Addendum)
It was great to see you today! Thank you for choosing [facility] for your primary care. Melissa Lloyd was seen for follow up.  Today we addressed: I will update you with lab results You have Trichomonas  - No sex for 7 days -Partner needs to be treated -Encourage condom use - F/U in 4 weeks for TOC Metronidazole (Flagyl) 500mg  twice a day x7 Days Call for any concerns   If you haven't already, sign up for My Chart to have easy access to your labs results, and communication with your primary care physician.  I recommend that you always bring your medications to each appointment as this makes it easy to ensure you are on the correct medications and helps [place] not miss refills when you need them. Call the clinic at [phone] if your symptoms worsen or you have any concerns.  You should return to our clinic Return if symptoms worsen or fail to improve. Please arrive 15 minutes before your appointment to ensure smooth check in process.  We appreciate your efforts in making this happen.  Thank you for allowing me to participate in your care, Alfredo Martinez, MD 01/06/2023, 3:57 PM PGY-3, [facility]

## 2023-01-07 ENCOUNTER — Telehealth: Payer: Self-pay

## 2023-01-07 DIAGNOSIS — N898 Other specified noninflammatory disorders of vagina: Secondary | ICD-10-CM

## 2023-01-07 LAB — RPR: RPR Ser Ql: NONREACTIVE

## 2023-01-07 LAB — HIV ANTIBODY (ROUTINE TESTING W REFLEX): HIV Screen 4th Generation wRfx: NONREACTIVE

## 2023-01-07 MED ORDER — METRONIDAZOLE 500 MG PO TABS
500.0000 mg | ORAL_TABLET | Freq: Two times a day (BID) | ORAL | 0 refills | Status: AC
Start: 2023-01-07 — End: 2023-01-14

## 2023-01-07 NOTE — Assessment & Plan Note (Signed)
Reviewed labs and allergies will check GC/Chlamydia, Trichomonas, RPR, and HIV and will call patient with results. Discussed safe sex practices. She was given an rx for Diflucan as she insisted that the symptoms felt just like previous yeast infection. Complied with request. Patient's questions answered to their satisfaction.   ADDITION: Found to have trichomonas on wet prep.  - No sex for 7 days -Partner needs to be treated -Encourage condom use - F/U in 4 weeks for TOC Metronidazole (Flagyl) 500mg  twice a day x7 Days

## 2023-01-07 NOTE — Telephone Encounter (Signed)
Patient calls nurse line in regards to Metronidazole prescription.   She reports she has no transportation and needs this to go to her mail order pharmacy.   I called [facility] and cancelled prescription via VM.   I have resent he prescription to mail order pharmacy.

## 2023-01-08 LAB — CERVICOVAGINAL ANCILLARY ONLY
Chlamydia: NEGATIVE
Comment: NEGATIVE
Comment: NEGATIVE
Comment: NORMAL
Neisseria Gonorrhea: NEGATIVE
Trichomonas: POSITIVE — AB

## 2023-01-09 ENCOUNTER — Encounter: Payer: Self-pay | Admitting: Student

## 2023-02-06 ENCOUNTER — Other Ambulatory Visit: Payer: Self-pay

## 2023-02-06 ENCOUNTER — Ambulatory Visit (INDEPENDENT_AMBULATORY_CARE_PROVIDER_SITE_OTHER): Payer: Medicare PPO | Admitting: Student

## 2023-02-06 VITALS — BP 152/67 | HR 69 | Ht 61.0 in | Wt 146.2 lb

## 2023-02-06 DIAGNOSIS — B009 Herpesviral infection, unspecified: Secondary | ICD-10-CM | POA: Diagnosis not present

## 2023-02-06 MED ORDER — VALACYCLOVIR HCL 1 G PO TABS
1000.0000 mg | ORAL_TABLET | Freq: Two times a day (BID) | ORAL | 0 refills | Status: DC
Start: 2023-02-06 — End: 2023-02-06

## 2023-02-06 MED ORDER — VALACYCLOVIR HCL 1 G PO TABS: 1000.0000 mg | ORAL_TABLET | Freq: Two times a day (BID) | ORAL | 0 refills | Status: DC

## 2023-02-06 NOTE — Progress Notes (Signed)
  SUBJECTIVE:   CHIEF COMPLAINT / HPI:   RASH Had rash for a few weeks   Location: Buttocks  Medications tried:  Patient believes may be caused by the most recent change in her medication  New medications or antibiotics: Metronidazole 12/2022 for trichomonas  RPR negative one month ago  Tick, Insect or new pet exposure: No Recent travel: No New detergent or soap: No Immunocompromised: No  Symptoms Itching: No Pain over rash: Yes Feeling ill all over: No Fever: No Mouth sores: No Face or tongue swelling: No Trouble breathing: No Joint swelling or pain: No  From chart review, + HSV1/HSV2 blood testing in 2013 but patient denies having HSV  PERTINENT  PMH / PSH:   HTN--valsartan-hydrochlorothiazide Allergic rhinitis  Asthma OA  AUB  Patient Care Team: Alfredo Martinez, MD as PCP - General (Family Medicine) OBJECTIVE:  BP (!) 152/67   Pulse 69   Ht 5\' 1"  (1.549 m)   Wt 146 lb 3.2 oz (66.3 kg)   LMP 07/19/2011   SpO2 100%   BMI 27.62 kg/m  Physical Exam  General: NAD, pleasant, able to participate in exam Respiratory: No respiratory distress Skin: warm and dry, no rashes noted Well circumscribed area of vesicle in gluteal cleft   Psych: Normal affect and mood  ASSESSMENT/PLAN:  HSV (herpes simplex virus) infection Assessment & Plan: Ddx: fungal infection, dermatitis, bullous impetigo, psoriasis, FDA, drug induced rash, HSV outbreak--which is the most likely given the presentation. Will treat with Valacyclovir x 10 days and recheck in 2 weeks. No systemic symptoms.   Orders: -     valACYclovir HCl; Take 1 tablet (1,000 mg total) by mouth 2 (two) times daily.  Dispense: 20 tablet; Refill: 0  Other orders -     valACYclovir HCl; Take 1 tablet (1,000 mg total) by mouth 2 (two) times daily.  Dispense: 20 tablet; Refill: 0    Additionally, opted to wait on increasing medication for HTN until recheck in 2 weeks. If high at that check, increase duo medication.    Return in about 2 weeks (around 02/20/2023). Alfredo Martinez, MD 02/06/2023, 2:14 PM PGY-3, [facility]

## 2023-02-06 NOTE — Patient Instructions (Addendum)
It was great to see you today! Thank you for choosing [facility] for your primary care.  Today we addressed: We will treat your rash with with Valacyclovir to take twice a day for 10 days  Return in 2 weeks if the rash continues  I want to see you in two months to swab for trichomonas to make sure we got rid of it  If you haven't already, sign up for My Chart to have easy access to your labs results, and communication with your primary care physician. I recommend that you always bring your medications to each appointment as this makes it easy to ensure you are on the correct medications and helps [place] not miss refills when you need them. Call the clinic at [phone] if your symptoms worsen or you have any concerns.  Return in about 2 weeks (around 02/20/2023). Please arrive 15 minutes before your appointment to ensure smooth check in process.  We appreciate your efforts in making this happen.  Thank you for allowing me to participate in your care, Alfredo Martinez, MD 02/06/2023, 2:03 PM PGY-3, [facility]

## 2023-02-06 NOTE — Assessment & Plan Note (Signed)
Ddx: fungal infection, dermatitis, bullous impetigo, psoriasis, FDA, drug induced rash, HSV outbreak--which is the most likely given the presentation. Will treat with Valacyclovir x 10 days and recheck in 2 weeks. No systemic symptoms.

## 2023-02-14 ENCOUNTER — Ambulatory Visit: Payer: Medicare PPO | Admitting: Student

## 2023-02-14 ENCOUNTER — Ambulatory Visit (INDEPENDENT_AMBULATORY_CARE_PROVIDER_SITE_OTHER): Payer: Medicare PPO

## 2023-02-14 ENCOUNTER — Ambulatory Visit (HOSPITAL_COMMUNITY)
Admission: EM | Admit: 2023-02-14 | Discharge: 2023-02-14 | Disposition: A | Discharge status: Home / Self Care | Payer: Medicare PPO | Attending: Nurse Practitioner | Admitting: Nurse Practitioner

## 2023-02-14 ENCOUNTER — Other Ambulatory Visit: Payer: Self-pay

## 2023-02-14 ENCOUNTER — Encounter (HOSPITAL_COMMUNITY): Payer: Self-pay | Admitting: *Deleted

## 2023-02-14 DIAGNOSIS — Z23 Encounter for immunization: Secondary | ICD-10-CM | POA: Diagnosis not present

## 2023-02-14 DIAGNOSIS — B029 Zoster without complications: Secondary | ICD-10-CM

## 2023-02-14 DIAGNOSIS — L03114 Cellulitis of left upper limb: Secondary | ICD-10-CM | POA: Diagnosis not present

## 2023-02-14 DIAGNOSIS — W540XXA Bitten by dog, initial encounter: Secondary | ICD-10-CM

## 2023-02-14 DIAGNOSIS — M7989 Other specified soft tissue disorders: Secondary | ICD-10-CM | POA: Diagnosis not present

## 2023-02-14 DIAGNOSIS — M79642 Pain in left hand: Secondary | ICD-10-CM

## 2023-02-14 MED ORDER — TETANUS-DIPHTH-ACELL PERTUSSIS 5-2.5-18.5 LF-MCG/0.5 IM SUSY
PREFILLED_SYRINGE | INTRAMUSCULAR | Status: AC
  Filled 2023-02-14: qty 0.5

## 2023-02-14 MED ORDER — TETANUS-DIPHTH-ACELL PERTUSSIS 5-2.5-18.5 LF-MCG/0.5 IM SUSY
0.5000 mL | PREFILLED_SYRINGE | Freq: Once | INTRAMUSCULAR | Status: AC
  Administered 2023-02-14: 0.5 mL via INTRAMUSCULAR

## 2023-02-14 MED ORDER — AMOXICILLIN-POT CLAVULANATE 875-125 MG PO TABS
1.0000 | ORAL_TABLET | Freq: Two times a day (BID) | ORAL | 0 refills | Status: AC
Start: 2023-02-14 — End: 2023-02-21
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
## 2023-02-14 NOTE — ED Triage Notes (Signed)
Pt has healing herpes site She wants checked

## 2023-02-14 NOTE — ED Triage Notes (Signed)
Pt reports she was bite in Lt hand by next door neighbors dog last night.Pt's left hand swollen on arrival to [facility]. Pt told by Phineas Real that the daog was current on vaccines.

## 2023-02-14 NOTE — Discharge Instructions (Signed)
The hand x-ray today does not show any broken or fractured bones in your hand or any foreign body.  Recommend keeping your hand wrapped with Ace wrap, keeping elevated at home, and applying ice 15 minutes on, 45 minutes off every hour while awake.  You can take Tylenol to help with pain in your hand.  Take the Augmentin as prescribed to treat cellulitis or skin infection.  We have updated tetanus shot today.  Please follow-up with the dog's owner and if you find out that the dog has not been updated for rabies in the past year, please return for rabies vaccine.

## 2023-03-04 ENCOUNTER — Other Ambulatory Visit: Payer: Self-pay

## 2023-03-04 DIAGNOSIS — I10 Essential (primary) hypertension: Secondary | ICD-10-CM

## 2023-03-04 DIAGNOSIS — J309 Allergic rhinitis, unspecified: Secondary | ICD-10-CM

## 2023-03-05 MED ORDER — VALSARTAN-HYDROCHLOROTHIAZIDE 80-12.5 MG PO TABS
1.0000 | ORAL_TABLET | Freq: Every day | ORAL | 0 refills | Status: DC
Start: 2023-03-05 — End: 2023-08-28

## 2023-03-05 MED ORDER — MONTELUKAST SODIUM 10 MG PO TABS
10.0000 mg | ORAL_TABLET | Freq: Every day | ORAL | 0 refills | Status: DC
Start: 2023-03-05 — End: 2023-08-28

## 2023-03-05 MED ORDER — FLUTICASONE PROPIONATE 50 MCG/ACT NA SUSP
2.0000 | Freq: Every day | NASAL | 1 refills | Status: DC
Start: 2023-03-05 — End: 2023-08-28

## 2023-03-10 ENCOUNTER — Ambulatory Visit: Payer: Medicare PPO

## 2023-03-10 VITALS — Temp 97.8°F

## 2023-03-10 DIAGNOSIS — Z23 Encounter for immunization: Secondary | ICD-10-CM | POA: Diagnosis not present

## 2023-03-11 DIAGNOSIS — S61452A Open bite of left hand, initial encounter: Secondary | ICD-10-CM | POA: Diagnosis not present

## 2023-03-11 DIAGNOSIS — S6391XA Sprain of unspecified part of right wrist and hand, initial encounter: Secondary | ICD-10-CM | POA: Diagnosis not present

## 2023-03-11 DIAGNOSIS — W540XXA Bitten by dog, initial encounter: Secondary | ICD-10-CM | POA: Diagnosis not present

## 2023-03-11 DIAGNOSIS — M79641 Pain in right hand: Secondary | ICD-10-CM | POA: Diagnosis not present

## 2023-03-11 DIAGNOSIS — M79642 Pain in left hand: Secondary | ICD-10-CM | POA: Diagnosis not present

## 2023-03-12 NOTE — Progress Notes (Signed)
Vaccinations administered by Jake Seats, CMA.   Veronda Prude, RN

## 2023-03-14 ENCOUNTER — Telehealth: Payer: Self-pay

## 2023-03-14 NOTE — Telephone Encounter (Signed)
Patient LVM on nurse line regarding prescription refills.   Patient states that she needs refills to go to different pharmacy.   Attempted to call patient back to determine which medications need to be refilled and to clarify preferred pharmacy.   She did not answer, unable to LVM . If patient calls back, please ask her to clarify the above.   Veronda Prude, RN

## 2023-04-17 ENCOUNTER — Ambulatory Visit: Payer: Medicare PPO | Admitting: Physician Assistant

## 2023-04-22 ENCOUNTER — Ambulatory Visit: Payer: Medicare PPO | Admitting: Physician Assistant

## 2023-04-23 ENCOUNTER — Ambulatory Visit: Payer: Medicare PPO | Admitting: Physician Assistant

## 2023-06-10 ENCOUNTER — Other Ambulatory Visit: Payer: Self-pay

## 2023-06-10 ENCOUNTER — Encounter (HOSPITAL_COMMUNITY): Payer: Self-pay | Admitting: Emergency Medicine

## 2023-06-10 ENCOUNTER — Emergency Department (HOSPITAL_COMMUNITY): Payer: Medicare PPO

## 2023-06-10 ENCOUNTER — Emergency Department (HOSPITAL_COMMUNITY)
Admission: EM | Admit: 2023-06-10 | Discharge: 2023-06-10 | Disposition: A | Discharge status: Home / Self Care | Payer: Medicare PPO | Attending: Emergency Medicine | Admitting: Emergency Medicine

## 2023-06-10 DIAGNOSIS — M79643 Pain in unspecified hand: Secondary | ICD-10-CM | POA: Diagnosis not present

## 2023-06-10 DIAGNOSIS — R109 Unspecified abdominal pain: Secondary | ICD-10-CM | POA: Diagnosis not present

## 2023-06-10 DIAGNOSIS — M25522 Pain in left elbow: Secondary | ICD-10-CM | POA: Diagnosis not present

## 2023-06-10 DIAGNOSIS — I10 Essential (primary) hypertension: Secondary | ICD-10-CM | POA: Insufficient documentation

## 2023-06-10 DIAGNOSIS — M25562 Pain in left knee: Secondary | ICD-10-CM | POA: Diagnosis not present

## 2023-06-10 DIAGNOSIS — M19071 Primary osteoarthritis, right ankle and foot: Secondary | ICD-10-CM | POA: Diagnosis not present

## 2023-06-10 DIAGNOSIS — S60511A Abrasion of right hand, initial encounter: Secondary | ICD-10-CM | POA: Diagnosis not present

## 2023-06-10 DIAGNOSIS — S60512A Abrasion of left hand, initial encounter: Secondary | ICD-10-CM | POA: Insufficient documentation

## 2023-06-10 DIAGNOSIS — M19022 Primary osteoarthritis, left elbow: Secondary | ICD-10-CM | POA: Diagnosis not present

## 2023-06-10 DIAGNOSIS — M25521 Pain in right elbow: Secondary | ICD-10-CM | POA: Diagnosis not present

## 2023-06-10 DIAGNOSIS — W1839XA Other fall on same level, initial encounter: Secondary | ICD-10-CM | POA: Diagnosis not present

## 2023-06-10 DIAGNOSIS — M19021 Primary osteoarthritis, right elbow: Secondary | ICD-10-CM | POA: Diagnosis not present

## 2023-06-10 DIAGNOSIS — S6991XA Unspecified injury of right wrist, hand and finger(s), initial encounter: Secondary | ICD-10-CM | POA: Diagnosis present

## 2023-06-10 DIAGNOSIS — W19XXXA Unspecified fall, initial encounter: Secondary | ICD-10-CM | POA: Diagnosis not present

## 2023-06-10 DIAGNOSIS — M25561 Pain in right knee: Secondary | ICD-10-CM | POA: Insufficient documentation

## 2023-06-10 DIAGNOSIS — Z79899 Other long term (current) drug therapy: Secondary | ICD-10-CM | POA: Diagnosis not present

## 2023-06-10 DIAGNOSIS — M25572 Pain in left ankle and joints of left foot: Secondary | ICD-10-CM | POA: Diagnosis not present

## 2023-06-10 DIAGNOSIS — S93401D Sprain of unspecified ligament of right ankle, subsequent encounter: Secondary | ICD-10-CM

## 2023-06-10 DIAGNOSIS — Z043 Encounter for examination and observation following other accident: Secondary | ICD-10-CM | POA: Diagnosis not present

## 2023-06-10 DIAGNOSIS — M25571 Pain in right ankle and joints of right foot: Secondary | ICD-10-CM | POA: Diagnosis not present

## 2023-06-10 DIAGNOSIS — M7731 Calcaneal spur, right foot: Secondary | ICD-10-CM | POA: Diagnosis not present

## 2023-06-10 MED ORDER — DICLOFENAC SODIUM 1 % EX GEL: 4.0000 g | Freq: Four times a day (QID) | CUTANEOUS | 0 refills | Status: AC

## 2023-06-10 MED ORDER — ALBUTEROL SULFATE HFA 108 (90 BASE) MCG/ACT IN AERS
1.0000 | INHALATION_SPRAY | Freq: Once | RESPIRATORY_TRACT | Status: AC
  Administered 2023-06-10: 1 via RESPIRATORY_TRACT
  Filled 2023-06-10: qty 6.7

## 2023-06-10 MED ORDER — METHOCARBAMOL 500 MG PO TABS
500.0000 mg | ORAL_TABLET | Freq: Once | ORAL | Status: AC
  Administered 2023-06-10: 500 mg via ORAL
  Filled 2023-06-10: qty 1

## 2023-06-10 NOTE — Progress Notes (Signed)
Orthopedic Tech Progress Note Patient Details:  SHELISA GRIST 11/18/1955 [id number]  Ortho Devices Type of Ortho Device: Shoulder immobilizer Ortho Device/Splint Location: LUE Ortho Device/Splint Interventions: Ordered, Application, Adjustment   Post Interventions Patient Tolerated: Well Instructions Provided: Care of device, Adjustment of device  [place] A Gerilyn Pilgrim 06/10/2023, 9:51 PM

## 2023-06-10 NOTE — ED Notes (Signed)
Pt s/p Xray.

## 2023-06-10 NOTE — ED Provider Notes (Signed)
[facility] Provider Note   CSN: [id number] Arrival date & time: 06/10/23  1803     History  Chief Complaint  Patient presents with   Melissa Lloyd is a 67 y.o. female with a history of hypertension and arthritis who presents the ED today via after fall.  Patient reports she was walking on the sidewalk and forward on her hands and knees around 5:30 PM this afternoon.  She denies hitting her head or loss of consciousness.  She called EMS and they brought her here for further evaluation.  She endorses abrasions to the palms of her hands and elbows with associated pain.  Patient has pain to the knees and ankles as well without associated weakness, numbness and tingling, or loss of sensation. She denies any additional complaints or concerns at this time.    Home Medications Prior to Admission medications   Medication Sig Start Date End Date Taking? Authorizing Provider  bacitracin-polymyxin b (POLYSPORIN) ophthalmic ointment APPLY ONE APPLICATION INTO THE LEFT EYE EVERY 12 HOURS WHILE AWAKE 06/13/22   Maury Dus, MD  diclofenac Sodium (VOLTAREN) 1 % GEL Apply 4 g topically 4 (four) times daily for 7 days. 06/10/23 06/17/23  Maxwell Marion, PA-C  fluticasone (FLONASE) 50 MCG/ACT nasal spray Place 2 sprays into both nostrils daily. 03/05/23   Alfredo Martinez, MD  montelukast (SINGULAIR) 10 MG tablet Take 1 tablet (10 mg total) by mouth daily. 03/05/23   Alfredo Martinez, MD  PROAIR HFA 108 [phone] Base) MCG/ACT inhaler INHALE TWO PUFFS BY MOUTH EVERY 4 HOURS AS NEEDED FOR WHEEZING OR SHORTNESS OF BREATH 11/04/22   Maury Dus, MD  valACYclovir (VALTREX) 1000 MG tablet Take 1 tablet (1,000 mg total) by mouth 2 (two) times daily. 02/06/23   Alfredo Martinez, MD  valACYclovir (VALTREX) 1000 MG tablet Take 1 tablet (1,000 mg total) by mouth 2 (two) times daily. 02/06/23   Alfredo Martinez, MD  valsartan-hydrochlorothiazide (DIOVAN HCT) 80-12.5 MG  tablet Take 1 tablet by mouth daily. 03/05/23   Alfredo Martinez, MD      Allergies    Patient has no known allergies.    Review of Systems   Review of Systems  Musculoskeletal:  Positive for arthralgias.  All other systems reviewed and are negative.   Physical Exam Updated Vital Signs BP (!) 161/71 (BP Location: Left Arm)   Pulse 70   Temp 97.8 F (36.6 C) (Oral)   Resp 16   Ht 5\' 1"  (1.549 m)   Wt 66.3 kg   LMP 07/19/2011   SpO2 97%   BMI 27.62 kg/m  Physical Exam Vitals and nursing note reviewed.  Constitutional:      General: She is not in acute distress.    Appearance: Normal appearance.  HENT:     Head: Normocephalic and atraumatic.     Mouth/Throat:     Mouth: Mucous membranes are moist.  Eyes:     Conjunctiva/sclera: Conjunctivae normal.     Pupils: Pupils are equal, round, and reactive to light.  Cardiovascular:     Rate and Rhythm: Normal rate and regular rhythm.     Pulses: Normal pulses.  Pulmonary:     Effort: Pulmonary effort is normal.     Breath sounds: Normal breath sounds.  Abdominal:     Palpations: Abdomen is soft.     Tenderness: There is no abdominal tenderness.  Musculoskeletal:        General: Tenderness present.  Comments: Tenderness to palpation bilateral elbows, knees, and ankles.  There is no gross deformities, swelling, or erythema to the joint space.  ROM of upper and lower extremities intact. Able to ambulate independently in the ED.  No midline tenderness to palpation of the cervical, thoracic, or lumbar spine. There is bilateral trapezius tenderness as well as right flank.  Skin:    General: Skin is warm and dry.     Findings: No rash.     Comments: Small abrasions present to the palms of the hands bilaterally.  There is no active bleeding.  Neurological:     General: No focal deficit present.     Mental Status: She is alert.     Sensory: No sensory deficit.     Motor: No weakness.     Gait: Gait normal.  Psychiatric:         Mood and Affect: Mood normal.        Behavior: Behavior normal.    ED Results / Procedures / Treatments   Labs (all labs ordered are listed, but only abnormal results are displayed) Labs Reviewed - No data to display  EKG None  Radiology DG Ankle Complete Left Result Date: 06/10/2023 CLINICAL DATA:  Pain after a fall. EXAM: LEFT ANKLE COMPLETE - 3+ VIEW COMPARISON:  None Available. FINDINGS: Old ununited ossicle over the medial malleolus. No evidence of acute fracture or subluxation. No focal bone lesion or bone destruction. Bone cortex and trabecular architecture appear intact. No radiopaque soft tissue foreign bodies. IMPRESSION: No acute bony abnormalities. Electronically Signed   By: Burman Nieves M.D.   On: 06/10/2023 19:58   DG Elbow Complete Right Result Date: 06/10/2023 CLINICAL DATA:  Pain after a fall EXAM: RIGHT ELBOW - COMPLETE 3+ VIEW COMPARISON:  None Available. FINDINGS: Mild degenerative changes with spurring on the lateral epicondyle, coronary process, and olecranon process. No evidence of acute fracture or dislocation. No focal bone lesion or bone destruction. No significant effusions. Soft tissues are unremarkable. IMPRESSION: Mild degenerative changes.  No acute bony abnormalities. Electronically Signed   By: Burman Nieves M.D.   On: 06/10/2023 19:57   DG Ankle Complete Right Result Date: 06/10/2023 CLINICAL DATA:  Pain after a fall EXAM: RIGHT ANKLE - COMPLETE 3+ VIEW COMPARISON:  11/17/2022 FINDINGS: Right ankle appears intact. No evidence of acute fracture or subluxation. No focal bone lesion or bone destruction. Bone cortex and trabecular architecture appear intact. Degenerative changes in the intertarsal joints. Small calcaneal spurs. IMPRESSION: No acute bony abnormalities.  Mild degenerative changes. Electronically Signed   By: Burman Nieves M.D.   On: 06/10/2023 19:56   DG Knee Complete 4 Views Right Result Date: 06/10/2023 CLINICAL DATA:  Pain  after a fall. EXAM: RIGHT KNEE - COMPLETE 4+ VIEW COMPARISON:  02/12/2015 FINDINGS: No evidence of fracture, dislocation, or joint effusion. No evidence of arthropathy or other focal bone abnormality. Soft tissues are unremarkable. IMPRESSION: No acute bony abnormalities. Electronically Signed   By: Burman Nieves M.D.   On: 06/10/2023 19:55   DG Knee Complete 4 Views Left Result Date: 06/10/2023 CLINICAL DATA:  Fall. EXAM: LEFT KNEE - COMPLETE 4+ VIEW COMPARISON:  02/12/2015 FINDINGS: No evidence of fracture, dislocation, or joint effusion. No evidence of arthropathy or other focal bone abnormality. Soft tissues are unremarkable. IMPRESSION: No acute bony abnormalities. Electronically Signed   By: Burman Nieves M.D.   On: 06/10/2023 19:54   DG Elbow Complete Left Result Date: 06/10/2023 CLINICAL DATA:  Pain after  a fall EXAM: LEFT ELBOW - COMPLETE 3+ VIEW COMPARISON:  None Available. FINDINGS: Mild degenerative changes in the left elbow with spurring on the medial epicondyle and coronoid process. No acute fracture or dislocation. No focal bone lesion or bone destruction. No significant effusions. IMPRESSION: No acute bony abnormalities.  Mild degenerative changes. Electronically Signed   By: Burman Nieves M.D.   On: 06/10/2023 19:53    Procedures Procedures: not indicated.   Medications Ordered in ED Medications  methocarbamol (ROBAXIN) tablet 500 mg (500 mg Oral Given 06/10/23 1911)  albuterol (VENTOLIN HFA) 108 (90 Base) MCG/ACT inhaler 1 puff (1 puff Inhalation Given 06/10/23 2110)    ED Course/ Medical Decision Making/ A&P                                 Medical Decision Making Amount and/or Complexity of Data Reviewed Radiology: ordered.  Risk Prescription drug management.   This patient presents to the ED for concern of fall, this involves an extensive number of treatment options, and is a complaint that carries with it a high risk of complications and morbidity.    Differential diagnosis includes: Fracture, dislocation, contusion, abrasion, etc.   Comorbidities  See HPI above   Additional History  Additional history obtained from prior records.   Imaging Studies  I ordered imaging studies including bilateral elbows, knees, and ankle x-rays I independently visualized and interpreted imaging which showed:  Arthritic changes were noted in the bilateral elbows otherwise no acute osseous findings in any of the x-rays. I agree with the radiologist interpretation   Problem List / ED Course / Critical Interventions / Medication Management  Fall on sidewalk I ordered medications including: Robaxin for pain Reevaluation of the patient after these medicines showed that the patient improved. Patient is requesting left elbow pain as well as a refill of her albuterol inhaler.  Albuterol inhaler ordered in the ED so patient can take at home with her. I have reviewed the patients home medicines and have made adjustments as needed   Social Determinants of Health  Physical activity   Test / Admission - Considered  Discussed findings with patient.  All questions were answered. She is hemodynamically stable and safe for discharge home. Return precautions provided.       Final Clinical Impression(s) / ED Diagnoses Final diagnoses:  Fall, initial encounter    Rx / DC Orders ED Discharge Orders          Ordered    diclofenac Sodium (VOLTAREN) 1 % GEL  4 times daily        06/10/23 2107              Maxwell Marion, PA-C 06/10/23 2116    Linwood Dibbles, MD 06/11/23 [phone]

## 2023-06-10 NOTE — Discharge Instructions (Addendum)
As discussed, your imaging does not show any acute fractures or dislocations.  Alternate between Ibuprofen and Tylenol every 4 hours as needed for pain.  I have also sent a refill of your Voltaren cream to the pharmacy.  You can put this on the areas of pain as well.  Follow-up with your primary care provider in the next week for reevaluation of your symptoms.    Return to the ED if your symptoms worsen in the interim.

## 2023-06-10 NOTE — ED Triage Notes (Signed)
Pt via EMS after mechanical fall. Abrasions to hands and bilateral elbows. Did not hit head or pass out.

## 2023-06-24 ENCOUNTER — Ambulatory Visit (HOSPITAL_COMMUNITY)
Admission: RE | Admit: 2023-06-24 | Discharge: 2023-06-24 | Disposition: A | Discharge status: Home / Self Care | Payer: Medicare Other | Source: Ambulatory Visit | Attending: Family Medicine | Admitting: Family Medicine

## 2023-06-24 ENCOUNTER — Ambulatory Visit (INDEPENDENT_AMBULATORY_CARE_PROVIDER_SITE_OTHER): Payer: Medicare Other | Admitting: Family Medicine

## 2023-06-24 ENCOUNTER — Encounter: Payer: Self-pay | Admitting: Family Medicine

## 2023-06-24 VITALS — BP 146/94 | HR 79 | Ht 61.0 in | Wt 149.8 lb

## 2023-06-24 DIAGNOSIS — M79644 Pain in right finger(s): Secondary | ICD-10-CM | POA: Diagnosis not present

## 2023-06-24 DIAGNOSIS — M1812 Unilateral primary osteoarthritis of first carpometacarpal joint, left hand: Secondary | ICD-10-CM | POA: Diagnosis not present

## 2023-06-24 MED ORDER — NAPROXEN 500 MG PO TBEC
500.0000 mg | DELAYED_RELEASE_TABLET | Freq: Two times a day (BID) | ORAL | 0 refills | Status: AC
Start: 2023-06-24 — End: 2023-07-01

## 2023-06-24 NOTE — Patient Instructions (Addendum)
 It was great to see you! Thank you for allowing me to participate in your care!  Our plans for today:  - Please take your Naprosyn  twice daily. I have sent this to your pharmacy. - You may also take Tylenol . - Please schedule an appointment for this week to drain your nail.  - Warn compresses may help your nail drain as well.   Please arrive 15 minutes PRIOR to your next scheduled appointment time! If you do not, this affects OTHER patients' care.  Take care and seek immediate care sooner if you develop any concerns.   Ozell Provencal, MD, PGY-2 [facility] 3:58 PM 06/24/2023  [facility]

## 2023-06-24 NOTE — Progress Notes (Signed)
    SUBJECTIVE:   CHIEF COMPLAINT / HPI: finger pain  Went to ED on 12/24 for fall.  States she had xrays of multiple joints and hand. States there was cut on her finger after the fall. Here today for follow-up because left 2nd finger is hurting. DIP is swollen and painful. Does not know of any fevers. Swelling has overall improved over the past 2 weeks. Ibuprofen has helped. Voltaren  gel has helped.  DIP range of motion is limited. Taking an NSAID  at night unsure what amount or which one Endorse cut when she fells.  PERTINENT  PMH / PSH: Reviewed  OBJECTIVE:   BP (!) 146/94   Pulse 79   Ht 5' 1 (1.549 m)   Wt 149 lb 12.8 oz (67.9 kg)   LMP 07/19/2011   SpO2 98%   BMI 28.30 kg/m   General: NAD, well appearing Neuro: A&O Respiratory: normal WOB on RA Extremities: Moving all 4 extremities equally Left hand:  2nd DIP mild swelling, ROM of DIP severely reduced due to pain, no erythema, very tender to palpation, mild area of fluctuance and purulence surrounding the proximal nailbed  ASSESSMENT/PLAN:   Assessment & Plan Finger pain, right Per chart review cannot locate x-rays of left hand to confirm and rule out distal second finger fracture.  Given my exam today there is also concomitant paronychia causing pain at this time.  As the swelling is improving and she has had no systemic symptoms we will treat conservatively with warm compresses and bacitracin  application at this time.  Discussed with patient following up the next 24 to 48 hours for reevaluation and possible I&D. Do not feel antibiotics are necessary at this time given improvement in swelling. Consider if not improving at reevaluation.  Will rule out intra-articular fracture requiring referral to hand surgery with complete x-rays of the left hand.  Pain control with Naprosyn  500 mg twice daily as well as Tylenol  as needed.  Continue Voltaren  gel.  Patient agreeable to plan.  Return in about 1 day (around  06/25/2023).  Ozell Provencal, MD [facility]

## 2023-06-25 ENCOUNTER — Telehealth: Payer: Self-pay | Admitting: Family Medicine

## 2023-06-25 ENCOUNTER — Encounter (HOSPITAL_COMMUNITY): Payer: Self-pay

## 2023-06-25 ENCOUNTER — Observation Stay: Admit: 2023-06-25

## 2023-06-25 ENCOUNTER — Ambulatory Visit (INDEPENDENT_AMBULATORY_CARE_PROVIDER_SITE_OTHER): Payer: Medicare Other | Admitting: Student

## 2023-06-25 VITALS — BP 137/73 | HR 90 | Ht 61.0 in | Wt 149.0 lb

## 2023-06-25 DIAGNOSIS — M65949 Unspecified synovitis and tenosynovitis, unspecified hand: Secondary | ICD-10-CM

## 2023-06-25 NOTE — Patient Instructions (Signed)
 Bed control for [facility] will give your daughter Ronal a call when a bed is available inpatient.  In the meantime you can stay at home.  You will be directly admitted to the family medicine service to receive IV antibiotics for your finger infection.

## 2023-06-25 NOTE — Assessment & Plan Note (Addendum)
 Due to duration of symptoms, uniformity of swelling of the left index finger, painful passive extension, and resting in passively flexed position warrants concern for flexor tenosynovitis per Kanavel's signs.  Will plan to directly admit patient to FMTS for IV antibiotics.  Discussed with patient the possibility of surgical debridement during admission. Patient is medically stable at this time, not warranting urgent ED referral. Bed control called for placement and they will call her daughter when a bed is available.   Reviewed DG of left hand and on independent interpretation does not appear to have an acute fracture.

## 2023-06-25 NOTE — Progress Notes (Addendum)
    SUBJECTIVE:   CHIEF COMPLAINT / HPI:   Melissa Lloyd is a 68 y.o. female  presenting for finger swelling and pain.   She reports having fallen on an iron PEG around Christmas which cut her finger open.  Since then she has had continued swelling and pain in the area.  She was recently seen in our office yesterday was prescribed naproxen  and topical ointment however this has not improved her symptoms.  She is experiencing chills.  She is unable to flex her finger.    PERTINENT  PMH / PSH: Reviewed and updated   OBJECTIVE:   BP 137/73   Pulse 90   Ht 5' 1 (1.549 m)   Wt 149 lb (67.6 kg)   LMP 07/19/2011   SpO2 91%   BMI 28.15 kg/m   Well-appearing, no acute distress Cardio: Regular rate, regular rhythm, no murmurs on exam. Pulm: Clear, no wheezing, no crackles. No increased work of breathing Abdominal: bowel sounds present, soft, non-tender, non-distended Extremities: Uniform swelling on the index finger of her left hand, hand is held in passively flexed position, pain with palpation of the flexor tendon and surrounding DIP and MIP joints, pain with passive extension of finger.       06/25/2023   10:00 AM 02/06/2023    1:43 PM 01/06/2023    3:25 PM  PHQ9 SCORE ONLY  PHQ-9 Total Score 3 1 1       ASSESSMENT/PLAN:   Flexor tenosynovitis of finger Due to duration of symptoms, uniformity of swelling of the left index finger, painful passive extension, and resting in passively flexed position warrants concern for flexor tenosynovitis per Kanavel's signs.  Will plan to directly admit patient to FMTS for IV antibiotics.  Discussed with patient the possibility of surgical debridement during admission. Patient is medically stable at this time, not warranting urgent ED referral. Bed control called for placement and they will call her daughter when a bed is available.   Reviewed DG of left hand and on independent interpretation does not appear to have an acute fracture.    FMTS team  notified for admission. Attending Dr. Jeanelle.   Damien Pinal, DO [facility]

## 2023-06-25 NOTE — Telephone Encounter (Addendum)
 I messaged admitting to check on direct admission via secure chat. I was advised that they reached out to the patient, and she is unable to come in late afternoon.  I called the patient to offer ED admission instead of waiting for a bed. She got very irate and screamed at me over the phone. She said the hospital admitting staff called her around 6:30 p.m. for her bed. However, she has no one to bring her in. She said we didn't tell her it could take that long to get a bed and I advise, we at the clinic have no control over bed assignment or when she gets called for a bed.  She is unable to go to the ED and prefers to call the clinic tomorrow morning. We discussed ED precautions. She eventually appreciated the call.  She'll likely need to restart the direct admission process tomorrow or go to the ED.   If she calls, please have her see a provider or RN team to help with processing hospital bed for direct admission. Diagnosis - Flexor Tenosynovitis of the finger

## 2023-06-26 ENCOUNTER — Other Ambulatory Visit: Payer: Self-pay

## 2023-06-26 ENCOUNTER — Emergency Department (HOSPITAL_COMMUNITY): Payer: Medicare Other

## 2023-06-26 ENCOUNTER — Encounter (HOSPITAL_COMMUNITY): Payer: Self-pay

## 2023-06-26 ENCOUNTER — Ambulatory Visit (INDEPENDENT_AMBULATORY_CARE_PROVIDER_SITE_OTHER): Admitting: Student

## 2023-06-26 ENCOUNTER — Emergency Department (HOSPITAL_COMMUNITY)
Admission: EM | Admit: 2023-06-26 | Discharge: 2023-06-26 | Disposition: A | Discharge status: Home / Self Care | Payer: Medicare Other | Source: Home / Self Care | Attending: Emergency Medicine | Admitting: Emergency Medicine

## 2023-06-26 ENCOUNTER — Emergency Department (HOSPITAL_COMMUNITY)
Admission: EM | Admit: 2023-06-26 | Discharge: 2023-06-26 | Discharge status: Against Medical Advice | Payer: Medicare Other | Attending: Emergency Medicine | Admitting: Emergency Medicine

## 2023-06-26 VITALS — BP 146/80 | HR 78

## 2023-06-26 DIAGNOSIS — M79645 Pain in left finger(s): Secondary | ICD-10-CM | POA: Diagnosis not present

## 2023-06-26 DIAGNOSIS — M79646 Pain in unspecified finger(s): Secondary | ICD-10-CM | POA: Insufficient documentation

## 2023-06-26 DIAGNOSIS — J45909 Unspecified asthma, uncomplicated: Secondary | ICD-10-CM | POA: Insufficient documentation

## 2023-06-26 DIAGNOSIS — Z5321 Procedure and treatment not carried out due to patient leaving prior to being seen by health care provider: Secondary | ICD-10-CM | POA: Insufficient documentation

## 2023-06-26 DIAGNOSIS — M79644 Pain in right finger(s): Secondary | ICD-10-CM

## 2023-06-26 DIAGNOSIS — S6992XA Unspecified injury of left wrist, hand and finger(s), initial encounter: Secondary | ICD-10-CM | POA: Diagnosis not present

## 2023-06-26 DIAGNOSIS — L03012 Cellulitis of left finger: Secondary | ICD-10-CM | POA: Insufficient documentation

## 2023-06-26 DIAGNOSIS — Z79899 Other long term (current) drug therapy: Secondary | ICD-10-CM | POA: Insufficient documentation

## 2023-06-26 DIAGNOSIS — I10 Essential (primary) hypertension: Secondary | ICD-10-CM | POA: Insufficient documentation

## 2023-06-26 LAB — CBC WITH DIFFERENTIAL/PLATELET
Abs Immature Granulocytes: 0.03 10*3/uL (ref 0.00–0.07)
Basophils Absolute: 0 10*3/uL (ref 0.0–0.1)
Basophils Relative: 0 %
Eosinophils Absolute: 0.2 10*3/uL (ref 0.0–0.5)
Eosinophils Relative: 2 %
HCT: 38.6 % (ref 36.0–46.0)
Hemoglobin: 12.6 g/dL (ref 12.0–15.0)
Immature Granulocytes: 0 %
Lymphocytes Relative: 23 %
Lymphs Abs: 1.6 10*3/uL (ref 0.7–4.0)
MCH: 31 pg (ref 26.0–34.0)
MCHC: 32.6 g/dL (ref 30.0–36.0)
MCV: 94.8 fL (ref 80.0–100.0)
Monocytes Absolute: 0.8 10*3/uL (ref 0.1–1.0)
Monocytes Relative: 12 %
Neutro Abs: 4.3 10*3/uL (ref 1.7–7.7)
Neutrophils Relative %: 63 %
Platelets: 290 10*3/uL (ref 150–400)
RBC: 4.07 MIL/uL (ref 3.87–5.11)
RDW: 12.2 % (ref 11.5–15.5)
WBC: 6.9 10*3/uL (ref 4.0–10.5)
nRBC: 0 % (ref 0.0–0.2)

## 2023-06-26 LAB — BASIC METABOLIC PANEL
Anion gap: 8 (ref 5–15)
BUN: 9 mg/dL (ref 8–23)
CO2: 24 mmol/L (ref 22–32)
Calcium: 8.9 mg/dL (ref 8.9–10.3)
Chloride: 106 mmol/L (ref 98–111)
Creatinine, Ser: 0.73 mg/dL (ref 0.44–1.00)
GFR, Estimated: >60 mL/min (ref >60)
Glucose, Bld: 166 mg/dL — ABNORMAL HIGH (ref 70–99)
Potassium: 4 mmol/L (ref 3.5–5.1)
Sodium: 138 mmol/L (ref 135–145)

## 2023-06-26 MED ORDER — OXYCODONE-ACETAMINOPHEN 5-325 MG PO TABS
1.0000 | ORAL_TABLET | Freq: Once | ORAL | Status: AC
  Administered 2023-06-26: 1 via ORAL
  Filled 2023-06-26: qty 1

## 2023-06-26 MED ORDER — DOXYCYCLINE HYCLATE 100 MG PO TABS
100.0000 mg | ORAL_TABLET | Freq: Once | ORAL | Status: AC
  Administered 2023-06-26: 100 mg via ORAL
  Filled 2023-06-26: qty 1

## 2023-06-26 MED ORDER — OXYCODONE HCL 5 MG PO TABS: 5.0000 mg | ORAL_TABLET | Freq: Four times a day (QID) | ORAL | 0 refills | Status: DC | PRN

## 2023-06-26 MED ORDER — LIDOCAINE HCL (PF) 1 % IJ SOLN
2.0000 mL | Freq: Once | INTRAMUSCULAR | Status: AC
  Administered 2023-06-26: 2 mL
  Filled 2023-06-26: qty 30

## 2023-06-26 MED ORDER — KETOROLAC TROMETHAMINE 15 MG/ML IJ SOLN
15.0000 mg | Freq: Once | INTRAMUSCULAR | Status: AC
  Administered 2023-06-26: 15 mg via INTRAMUSCULAR
  Filled 2023-06-26: qty 1

## 2023-06-26 NOTE — Telephone Encounter (Signed)
 Discussed with RN. Recommendations to RN for the patient Go to the ED See a provider today Have CMA contact admitting to reinitiate direct admission if approved by the inpatient attending  I tried to come up with a plan with her yesterday. However, she will not give me the chance to talk or complete my sentence. She was shouting and upset, although I was trying to help.  As discussed with her yesterday, IV antibiotic is recommended for her finger infection, unless it has improved from her evaluation from yesterday.

## 2023-06-26 NOTE — ED Notes (Signed)
 Pt stated she was not going to wait this long and was going home. Moved OTF.

## 2023-06-26 NOTE — ED Provider Triage Note (Signed)
 Emergency Medicine Provider Triage Evaluation Note  Melissa Lloyd , a 68 y.o. female  was evaluated in triage.  Pt complains of finger pain. Pt injured L index finger 1 week ago from a fall. Developed infection to the distal portion of the finger.  Likely needing I&D.  [facility] but [facility] due to [facility] wait  Review of Systems  Positive: As above Negative: As above  Physical Exam  BP (!) 179/79   Pulse 71   Temp 98 F (36.7 C)   Resp 18   Ht 5' 1 (1.549 m)   Wt 70 kg   LMP 07/19/2011   SpO2 100%   BMI 29.16 kg/m  Gen:   Awake, no distress   Resp:  Normal effort  MSK:   Moves extremities without difficulty  Other:  Paronychia to L index finger  Medical Decision Making  Medically screening exam initiated at 3:32 PM.  Appropriate orders placed.  Melissa Lloyd was informed that the remainder of the evaluation will be completed by another provider, this initial triage assessment does not replace that evaluation, and the importance of remaining in the ED until their evaluation is complete.     Melissa Colon, PA-C 06/26/23 1533

## 2023-06-26 NOTE — Discharge Instructions (Addendum)
 Relating to evaluate you today.  It appears that you had a collection of pus in your finger called a paronychia.  I have drained this.  I need you to pick up [facility] [address] as to ensure no further infection ensues. Take pain medication as prescribed but be aware it may cause drowsiness. Have also provided you with hand surgeon outpatient follow-up so they can see progression with antibiotics  Return to emergency department if you experience significant worsening symptoms, inability to bend finger especially in setting of fever

## 2023-06-26 NOTE — Progress Notes (Signed)
 Brief Note Patient presented with daughter for re-evaluation and admission. Discussed direct admission for suspected tenosynovitis. They do not wish to go through this process and want to be seen in ED. Patient taken to ED by daughter. They declined exam and evaluation today. Patient safely transported by daughter to ED for suspected flexor tenosynovitis.  Suzann Daring, MD  Family Medicine Teaching Service

## 2023-06-26 NOTE — ED Triage Notes (Addendum)
 Patient had a fall yesterday and injured her left pointer finger. Had xray earlier today. Was told she needs IV antibiotics for her finger infection.

## 2023-06-26 NOTE — Telephone Encounter (Signed)
 Patients daughter calls nurse line.   She reports they are at the ED now as directed, however no one has come to help them.   Daughter was under the impression they would be helped immediately. She reports they were directed to go to the ED for IV antibiotics and a room.   Advised according to the notes the patient was advised a direct admit, however she declined and opted to go to the ED instead. Daughter reports she believes there was miscommunication.  Advised they are in the best place for concern.   Daughter was appreciative of discussion.

## 2023-06-26 NOTE — ED Triage Notes (Signed)
 Pt states that approximately one week ago she had a mechanical fall and injured her hands and elbows. Pt seen outpatient for L index finger injury and attempted to be admitted for IV abx, but pt declined and is now back for same. Denies new injuries or fevers.

## 2023-06-26 NOTE — Progress Notes (Signed)
 Orthopedic Tech Progress Note Patient Details:  Melissa Lloyd 10-26-1955 [id number]  LSO stat ordered through [facility] for patient   Patient ID: [name], female   DOB: 1956-03-30, 68 y.o.   MRN: [id number]  Diannia Ruder 06/26/2023, 9:24 PM

## 2023-06-26 NOTE — ED Provider Notes (Addendum)
 [place] EMERGENCY DEPARTMENT AT [facility] Provider Note   CSN: [id number] Arrival date & time: 06/26/23  1457     History  Chief Complaint  Patient presents with   Melissa Lloyd is a 68 y.o. female with past medical history of HTN, asthma presents emergency department to be evaluated for possible flexor tenosynovitis of her left pointer finger by her PCP.  She attempted to get evaluated at [facility] ED today and had x-ray but was not evaluated by provider due to extended wait time.  X-rays negative for fracture.   She complains of diffuse pain to left index finger with fusiform swelling. She denies fevers.   Fall Pertinent negatives include no chest pain, no abdominal pain, no headaches and no shortness of breath.       Home Medications Prior to Admission medications   Medication Sig Start Date End Date Taking? Authorizing Provider  bacitracin -polymyxin b  (POLYSPORIN ) ophthalmic ointment APPLY ONE APPLICATION INTO THE LEFT EYE EVERY 12 HOURS WHILE AWAKE 06/13/22   Malvina Ellen, MD  fluticasone  (FLONASE ) 50 MCG/ACT nasal spray Place 2 sprays into both nostrils daily. 03/05/23   Bryan Bianchi, MD  montelukast  (SINGULAIR ) 10 MG tablet Take 1 tablet (10 mg total) by mouth daily. 03/05/23   Bryan Bianchi, MD  naproxen  (EC NAPROSYN ) 500 MG EC tablet Take 1 tablet (500 mg total) by mouth 2 (two) times daily with a meal for 7 days. 06/24/23 07/01/23  Quillen, Michael, MD  PROAIR  HFA 108 (90 Base) MCG/ACT inhaler INHALE TWO PUFFS BY MOUTH EVERY 4 HOURS AS NEEDED FOR WHEEZING OR SHORTNESS OF BREATH 11/04/22   Malvina Ellen, MD  valACYclovir  (VALTREX ) 1000 MG tablet Take 1 tablet (1,000 mg total) by mouth 2 (two) times daily. 02/06/23   Bryan Bianchi, MD  valACYclovir  (VALTREX ) 1000 MG tablet Take 1 tablet (1,000 mg total) by mouth 2 (two) times daily. 02/06/23   Bryan Bianchi, MD  valsartan -hydrochlorothiazide  (DIOVAN  HCT) 80-12.5 MG tablet Take 1 tablet by mouth  daily. 03/05/23   Bryan Bianchi, MD      Allergies    Patient has no known allergies.    Review of Systems   Review of Systems  Constitutional:  Negative for chills, fatigue and fever.  Respiratory:  Negative for cough, chest tightness, shortness of breath and wheezing.   Cardiovascular:  Negative for chest pain and palpitations.  Gastrointestinal:  Negative for abdominal pain, constipation, diarrhea, nausea and vomiting.  Skin:  Positive for color change and wound.  Neurological:  Negative for dizziness, seizures, weakness, light-headedness, numbness and headaches.    Physical Exam Updated Vital Signs BP (!) 148/82 (BP Location: Right Arm)   Pulse 63   Temp 97.7 F (36.5 C) (Oral)   Resp 16   Ht 5' 1 (1.549 m)   Wt 70 kg   LMP 07/19/2011   SpO2 96%   BMI 29.16 kg/m  Physical Exam Vitals and nursing note reviewed.  Constitutional:      General: She is not in acute distress.    Appearance: Normal appearance.  HENT:     Head: Normocephalic and atraumatic.  Eyes:     Conjunctiva/sclera: Conjunctivae normal.  Cardiovascular:     Rate and Rhythm: Normal rate.     Pulses:          Radial pulses are 2+ on the right side and 2+ on the left side.  Pulmonary:     Effort: Pulmonary effort is normal. No  respiratory distress.  Skin:    General: Skin is warm.     Capillary Refill: Capillary refill takes less than 2 seconds.     Coloration: Skin is not jaundiced or pale.     Comments: Paronychia with fluctuance to left index finger circumferential about nail bed. Swelling to digit from tip of finger to MCP but able to flex and extend (photo in media). Mildly TTP of finger diffusely  Neurological:     Mental Status: She is alert and oriented to person, place, and time. Mental status is at baseline.     Comments: Median, radial, and ulnar nerves intact     ED Results / Procedures / Treatments   Labs (all labs ordered are listed, but only abnormal results are displayed) Labs  Reviewed - No data to display  EKG None  Radiology DG Finger Index Left Result Date: 06/26/2023 CLINICAL DATA:  Status post fall with injury to the hand one-week ago with finger pain and swelling EXAM: LEFT INDEX FINGER 3V COMPARISON:  Left hand radiograph dated 06/24/2023 FINDINGS: There is no evidence of fracture or dislocation. No radiopaque foreign body. Diffuse soft tissue swelling of the index finger. IMPRESSION: 1. No acute fracture or dislocation. 2. Diffuse soft tissue swelling of the index finger. Electronically Signed   By: Limin  Xu M.D.   On: 06/26/2023 13:44    Procedures .Incision and Drainage  Date/Time: 06/26/2023 8:27 PM  Performed by: Minnie Tinnie BRAVO, PA Authorized by: Minnie Tinnie BRAVO, PA   Consent:    Consent obtained:  Verbal   Consent given by:  Patient   Risks, benefits, and alternatives were discussed: yes     Risks discussed:  Bleeding   Alternatives discussed:  No treatment Universal protocol:    Procedure explained and questions answered to patient or proxy's satisfaction: yes     Patient identity confirmed:  Verbally with patient and arm band Location:    Indications for incision and drainage: paryonchia.   Size:  2cm circumfrentially around nail bed   Location:  Upper extremity   Upper extremity location:  Finger   Finger location:  L index finger Pre-procedure details:    Skin preparation:  Antiseptic wash Sedation:    Sedation type:  None Anesthesia:    Anesthesia method:  Nerve block   Block needle gauge:  25 G   Block anesthetic:  Lidocaine  1% w/o epi   Block technique:  MCP   Block injection procedure:  Anatomic landmarks identified, introduced needle, anatomic landmarks palpated, negative aspiration for blood and incremental injection   Block outcome:  Anesthesia achieved Procedure type:    Complexity:  Simple Procedure details:    Drainage:  Purulent   Drainage amount:  Moderate   Wound treatment:  Wound left open   Packing materials:   1/4 in iodoform gauze Post-procedure details:    Procedure completion:  Tolerated well, no immediate complications     Medications Ordered in ED Medications  oxyCODONE -acetaminophen  (PERCOCET/ROXICET) 5-325 MG per tablet 1 tablet (1 tablet Oral Given 06/26/23 1539)  ketorolac  (TORADOL ) 15 MG/ML injection 15 mg (15 mg Intramuscular Given 06/26/23 1940)  lidocaine  (PF) (XYLOCAINE ) 1 % injection 2 mL (2 mLs Other Given by Other 06/26/23 1954)    ED Course/ Medical Decision Making/ A&P                                 Medical Decision Making Risk  Prescription drug management.   Patient presents to the ED for concern of left index finger swelling and pain, this involves an extensive number of treatment options, and is a complaint that carries with it a high risk of complications and morbidity.  The differential diagnosis includes paronychia, flexor tenosynovitis, cellulitis, tendon damage   Co morbidities that complicate the patient evaluation  Recent fall Current paronychia    Additional history obtained:  Additional history obtained from Nursing and Outside Medical Records   External records from outside source obtained and reviewed including  Triage RN note Family medicine office visit from 06/24/2023, 06/25/2023, 06/26/2023 regarding concern for flexor tenosynovitis Attempted to have directed met however patient adamantly refused admission and wanted to be evaluated in ED first   Lab Tests:  I Ordered, and personally interpreted labs.  The pertinent results include:   CBG 166 No leukocytosis   Imaging Studies ordered:  I ordered imaging studies including x-ray of finger I independently visualized and interpreted imaging which showed no free EXTR I agree with the radiologist interpretation    Medicines ordered and prescription drug management:  I ordered medication including Percocet, Toradol , lidocaine  for pain and nerve block Reevaluation of the patient after these  medicines showed that the patient improved I have reviewed the patients home medicines and have made adjustments as needed    Consultations Obtained:  I requested consultation with the hand surgeon (Dr. Murrell),  and discussed lab and imaging findings as well as pertinent plan - they recommend: doxy, drain, and f/u outpatient.     Problem List / ED Course:  Paronychia Fluctuance circumferentially around left index finger nail bed I&D (see above) - following, patient reports significant improvement of pain I do not have high suspicion for tenosynovitis as patient is able to flex finger following digital block.  Vital signs within normal limits with no tachycardia, fever.  She does not have leukocytosis With hand surgeon who agrees with outpatient management and oral antibiotics.  He will see patient in office within a week if symptoms are not improving. IM contacted regarding ED workup by Dr. Patt Discussed return to emergency department precautions with patient who agrees with plan.  She is very agreeable to discharge stating that her symptoms are significantly improved.  All questions answered to her satisfaction.  Discharge instructions provided. Fall initial encounter Reports when she was evaluated she was supposed to get wrist brace and back brace until she was able to f.u with ortho Dr. Patt ordered this   Reevaluation:  After the interventions noted above, I reevaluated the patient and found that they have :improved   Social Determinants of Health:  Has pcp   Dispostion:  After consideration of the diagnostic results and the patients response to treatment, I feel that the patent would benefit from outpatient management with hand to see if symptoms improve with outpatient abx and I&D.   Dr. Patt individually assessed patient and agrees with plan. He had significant aid in patient care Final Clinical Impression(s) / ED Diagnoses Final diagnoses:  Paronychia of finger of left  hand    Rx / DC Orders ED Discharge Orders     None         Minnie Tinnie BRAVO, PA 06/26/23 2150    Minnie Tinnie BRAVO, PA 06/26/23 2218    Patt Alm Macho, MD 06/26/23 2244

## 2023-06-26 NOTE — ED Provider Triage Note (Signed)
 Emergency Medicine Provider Triage Evaluation Note  Melissa Lloyd , a 68 y.o. female  was evaluated in triage.  Pt sent from family practice clinic for concern of flexor tenosynovitis.  No fever.  Had a fall 1 week ago..  Review of Systems  Positive: As above Negative: As above  Physical Exam  BP (!) 158/76 (BP Location: Right Arm)   Pulse 82   Temp 97.8 F (36.6 C)   Resp 16   LMP 07/19/2011   SpO2 100%  Gen:   Awake, no distress   Resp:  Normal effort  MSK:   Moves extremities without difficulty  Other:  Tenderness along the tendon sheath over the second digit.  Diffuse swelling.  Unable to fully flex.  She is able to fully extend the finger.  Medical Decision Making  Medically screening exam initiated at 1:10 PM.  Appropriate orders placed.  Melissa Lloyd was informed that the remainder of the evaluation will be completed by another provider, this initial triage assessment does not replace that evaluation, and the importance of remaining in the ED until their evaluation is complete.     Melissa Loge, PA-C 06/26/23 1311

## 2023-06-26 NOTE — Telephone Encounter (Signed)
 Patient returns call to nurse line.   Spoke with Dr. Anders regarding next steps.   Advised that we would need to start direct admission process over again and recommended that patient schedule office visit.   Advised patient of this in which she began to scream at me stating, why do I have to come in again, my finger hurts!   Explained to patient that we are trying to help her get the care that she needs, however, we do not have control over when beds become available at the hospital. Patient ultimately decided to schedule appointment for this AM.   Chiquita JAYSON English, RN

## 2023-07-10 ENCOUNTER — Ambulatory Visit: Payer: Medicare PPO

## 2023-07-11 ENCOUNTER — Telehealth: Payer: Self-pay

## 2023-07-11 NOTE — Telephone Encounter (Signed)
Copied from CRM [id number]. Topic: Appointments - Scheduling Inquiry for Clinic >> Jul 10, 2023  4:31 PM Alvino Blood C wrote: Reason for CRM: Patient missed her AWV and would like a call back to have it rescheduled. Please call patient back at [phone] (M)    This is not a patient of [facility]

## 2023-07-23 ENCOUNTER — Telehealth: Payer: Self-pay

## 2023-07-23 NOTE — Telephone Encounter (Signed)
 Jamie with [facility] [employer] calls nurse line in regards to medical records.   She reports she has faxed over a request for billing and clinical notes. She reports she received the billing information back, however not the clinical notes.   Advised will forward to [facility].   She reports she is faxing over another request.

## 2023-08-05 ENCOUNTER — Telehealth: Payer: Self-pay

## 2023-08-05 ENCOUNTER — Ambulatory Visit (HOSPITAL_COMMUNITY): Admission: EM | Admit: 2023-08-05 | Discharge: 2023-08-05 | Discharge status: Against Medical Advice

## 2023-08-05 NOTE — Telephone Encounter (Signed)
Patient came into clinic requesting gauze for her left index finger. She was seen in our office last month for this concern.   She has a finger splint at home, however, does not have the gauze to wrap around finger.   Provided patient with a few gauze rolls and tape.   Advised patient to schedule follow up visit.   Veronda Prude, RN

## 2023-08-28 ENCOUNTER — Other Ambulatory Visit: Payer: Self-pay

## 2023-08-28 DIAGNOSIS — I10 Essential (primary) hypertension: Secondary | ICD-10-CM

## 2023-08-28 DIAGNOSIS — B009 Herpesviral infection, unspecified: Secondary | ICD-10-CM

## 2023-08-28 DIAGNOSIS — J309 Allergic rhinitis, unspecified: Secondary | ICD-10-CM

## 2023-09-01 MED ORDER — MONTELUKAST SODIUM 10 MG PO TABS: 10.0000 mg | ORAL_TABLET | Freq: Every day | ORAL | 0 refills | Status: DC

## 2023-09-01 MED ORDER — VALSARTAN-HYDROCHLOROTHIAZIDE 80-12.5 MG PO TABS: 1.0000 | ORAL_TABLET | Freq: Every day | ORAL | 0 refills | Status: DC

## 2023-09-01 MED ORDER — ALBUTEROL SULFATE HFA 108 (90 BASE) MCG/ACT IN AERS: 1.0000 | INHALATION_SPRAY | RESPIRATORY_TRACT | 1 refills | Status: DC | PRN

## 2023-09-01 MED ORDER — FLUTICASONE PROPIONATE 50 MCG/ACT NA SUSP: 2.0000 | Freq: Every day | NASAL | 1 refills | Status: DC

## 2023-11-25 ENCOUNTER — Encounter: Payer: Self-pay | Admitting: *Deleted

## 2023-12-15 ENCOUNTER — Other Ambulatory Visit: Payer: Self-pay | Admitting: Student

## 2023-12-15 DIAGNOSIS — J309 Allergic rhinitis, unspecified: Secondary | ICD-10-CM

## 2023-12-15 DIAGNOSIS — I10 Essential (primary) hypertension: Secondary | ICD-10-CM

## 2024-01-01 ENCOUNTER — Ambulatory Visit: Admitting: Physician Assistant

## 2024-02-03 ENCOUNTER — Ambulatory Visit: Admitting: Family Medicine

## 2024-04-07 ENCOUNTER — Telehealth: Payer: Self-pay | Admitting: Family Medicine

## 2024-04-07 DIAGNOSIS — J309 Allergic rhinitis, unspecified: Secondary | ICD-10-CM

## 2024-04-07 MED ORDER — FLUTICASONE PROPIONATE 50 MCG/ACT NA SUSP: 2.0000 | Freq: Every day | NASAL | 1 refills | Status: DC

## 2024-04-07 MED ORDER — ALBUTEROL SULFATE (2.5 MG/3ML) 0.083% IN NEBU: 2.5000 mg | INHALATION_SOLUTION | Freq: Four times a day (QID) | RESPIRATORY_TRACT | 1 refills | Status: DC | PRN

## 2024-04-07 MED ORDER — MONTELUKAST SODIUM 10 MG PO TABS: 10.0000 mg | ORAL_TABLET | Freq: Every day | ORAL | 0 refills | Status: DC

## 2024-04-07 MED ORDER — ALBUTEROL SULFATE HFA 108 (90 BASE) MCG/ACT IN AERS: 1.0000 | INHALATION_SPRAY | RESPIRATORY_TRACT | 1 refills | Status: DC | PRN

## 2024-04-07 NOTE — Telephone Encounter (Signed)
**  After Hours/ Emergency Line Call**  Received a page to call [phone] Patient: Melissa Lloyd  Caller: Self  Confirmed name & DOB of patient with caller  Subjective:  Patient called stating she has been out of her asthma medication and she feels like her symptoms are flaring up.  Reports she is having more phlegm production that she feels like it is due to the change in the weather.  Denies any shortness of breath.  Reports she would like her albuterol  inhaler and nebulizer in addition to her allergy medication.  States she has limited transportation and has been unable to pick up meds.  States she will have her [facility] to [facility] her [facility]s that she usually has them delivered.  Objective:  Observations: NAD, speaking in full sentences.    Assessment & Plan  Melissa Lloyd is a 68 y.o. female with PMHx s/f asthma who calls with concern of asthma symptoms in the setting of being out of her medication.  Patient speaking in full sentences and denies any dyspnea.  Out of her albuterol  inhalers and allergy medication, provided refills.  Recommended patient come in the office for evaluation given persistent asthma symptoms and multiple health maintenance gaps.  Patient agreeable to appointment on 03/2023 at 3:10 PM for evaluation.  Will forward to physician seeing the patient.  -- Red flags discussed.   -- Will forward to PCP.  Izetta Nap, MD [facility], PGY-3

## 2024-04-09 ENCOUNTER — Ambulatory Visit: Payer: Self-pay | Admitting: Family Medicine

## 2024-04-09 NOTE — Progress Notes (Deleted)
    SUBJECTIVE:   CHIEF COMPLAINT / HPI:   Asthma  PERTINENT  PMH / PSH: ***  OBJECTIVE:   LMP 07/19/2011   ***  ASSESSMENT/PLAN:   Assessment & Plan      Damien Cassis, MD [facility]

## 2024-04-13 ENCOUNTER — Ambulatory Visit: Admitting: Student

## 2024-04-19 ENCOUNTER — Encounter: Payer: Self-pay | Admitting: Radiology

## 2024-06-24 ENCOUNTER — Telehealth: Payer: Self-pay

## 2024-06-24 NOTE — Telephone Encounter (Signed)
 [name], patients daughter, presents to [facility] to discuss patient.   She reports her mother has been exhibiting strange behaviors over the last several weeks.   She reports she has had a hoarding problem for sometime now. However, she has been calling the police for no reason and has had complaints from [facility] PD for 911 misuse.   She believes she owns a home that she does not. The police have been called multiple times for disturbance at said residence.   Melissa Lloyd reports she is concerned for her mothers safety. Her mother does live with her brother at this time.   Advised we would need to see her for an evaluation.  Melissa Lloyd reports she will try and discuss making an apt with her mother. Melissa Lloyd reports she believes she has POA over her mother and has the documentation somewhere.   Advised will forward to PCP for any further recommendations.

## 2024-06-24 NOTE — Telephone Encounter (Signed)
 I did advise on walk in [facility] for crisis.  Daughter stated, she would never go there.   Information given for possible future needs.   She was appreciative.

## 2024-07-01 NOTE — Progress Notes (Signed)
" ° °  SUBJECTIVE:   CHIEF COMPLAINT / HPI:   Discussed the use of AI scribe software for clinical note transcription with the patient, who gave verbal consent to proceed.  History of Present Illness Melissa Lloyd is a 69 year old female who presents with concerns about changes in behavior and memory from her daughter. She is accompanied by her daughter, Ronal.  Cognitive and behavioral changes - Recent unusual behaviors including twice giving money to strangers to count - Questionable handling of a housing dispute over property ownership - Daughter expresses concern for changes in judgment and cognition - Increased forgetfulness since COVID, such as misplacing items - Patient denies significant memory problems - Manages finances and medications independently. Still drives. - No recent falls, head injuries, or hallucinations - no changes in medications though did not take antihypertensive medication today - Independent with cooking, daily activities, and managing finances - No changes in diet - Denies alcohol, or illicit drug use - Occasional balance issues related to prior injuries - reports sleeping well - currently lives with son though he has a crack cocaine addiction - APS social worker has been out to the home and deemed not livable per daughter, is set to come out again next week - some delusions, states the president came to her church and the [employer] has a picture of them hugging.  Medication management - Current medications include albuterol  inhaler, topical antibiotic ointment, Flonase , Singulair , and an unspecified pink blood pressure pill - Noticed a different appearance of her blood pressure pill from [facility] - Did not take blood pressure medication today due to being rushed - Denies vision changes, headache   OBJECTIVE:   BP (!) 180/82   Pulse 87   Temp 97.8 F (36.6 C)   Wt 146 lb 6.4 oz (66.4 kg)   LMP 07/19/2011   SpO2 97%   BMI 27.66 kg/m    Gen: well appearing, in NAD Card: RRR Lungs: CTAB Ext: WWP, no edema MSK: Tone and bulk normal. Normal gait.  No edema.  Neuro: Alert and oriented. Hearing grossly intact bilaterally.  Psych: speech slightly pressured, nontangential. Easily angered. Appropriately dressed for weather and well groomed.   Mini Cog Words: apple, penny, table Immediate recall: 3/3 Delayed recall: 0/3 Clock drawing: appropriate appearance, round circle with numbers in correct positioning and order. Hands length and positioning incorrect.  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 Abnormal Minicog. With some delusions. - Ordered lab work to look for reversible causes. - Scheduled follow-up in 2-4 weeks to review labs and assess cognitive function. - obtain full neuro exam and order MRI brain at that time.   HYPERTENSION, BENIGN ESSENTIAL Uncontrolled, didn't take meds today. - refill provided, f/u 2-4 week for recheck - labs today.  Allergic rhinitis Refills provided.   Total time spent on today's visit was greater than 30 minutes, including both face-to-face time and nonface-to-face time personally spent on review of chart (labs and imaging), discussing labs and goals, discussing further work-up, treatment options, referrals to specialist if needed, reviewing outside records of pertinent, answering patient's questions, and coordinating care.   Donald CHRISTELLA Lai, DO "

## 2024-07-02 ENCOUNTER — Encounter: Payer: Self-pay | Admitting: Family Medicine

## 2024-07-02 ENCOUNTER — Ambulatory Visit (INDEPENDENT_AMBULATORY_CARE_PROVIDER_SITE_OTHER): Admitting: Family Medicine

## 2024-07-02 VITALS — BP 180/82 | HR 87 | Temp 97.8°F | Wt 146.4 lb

## 2024-07-02 DIAGNOSIS — R4189 Other symptoms and signs involving cognitive functions and awareness: Secondary | ICD-10-CM | POA: Diagnosis not present

## 2024-07-02 DIAGNOSIS — Z23 Encounter for immunization: Secondary | ICD-10-CM

## 2024-07-02 DIAGNOSIS — F22 Delusional disorders: Secondary | ICD-10-CM

## 2024-07-02 DIAGNOSIS — I10 Essential (primary) hypertension: Secondary | ICD-10-CM

## 2024-07-02 DIAGNOSIS — R4689 Other symptoms and signs involving appearance and behavior: Secondary | ICD-10-CM | POA: Insufficient documentation

## 2024-07-02 DIAGNOSIS — J309 Allergic rhinitis, unspecified: Secondary | ICD-10-CM | POA: Diagnosis not present

## 2024-07-02 MED ORDER — FLUTICASONE PROPIONATE 50 MCG/ACT NA SUSP: 2.0000 | Freq: Every day | NASAL | 1 refills | Status: AC

## 2024-07-02 MED ORDER — MONTELUKAST SODIUM 10 MG PO TABS: 10.0000 mg | ORAL_TABLET | Freq: Every day | ORAL | 0 refills | Status: AC

## 2024-07-02 MED ORDER — ALBUTEROL SULFATE HFA 108 (90 BASE) MCG/ACT IN AERS: 1.0000 | INHALATION_SPRAY | RESPIRATORY_TRACT | 1 refills | Status: AC | PRN

## 2024-07-02 MED ORDER — VALSARTAN-HYDROCHLOROTHIAZIDE 80-12.5 MG PO TABS: 1.0000 | ORAL_TABLET | Freq: Every day | ORAL | 0 refills | Status: AC

## 2024-07-02 NOTE — Assessment & Plan Note (Signed)
 Uncontrolled, didn't take meds today. - refill provided, f/u 2-4 week for recheck - labs today.

## 2024-07-02 NOTE — Assessment & Plan Note (Signed)
 Refills provided

## 2024-07-02 NOTE — Patient Instructions (Addendum)
 It was great to see you!  Our plans for today:  - We are checking some labs today, we will release these results to your MyChart. - Come back in 4 weeks for follow up or sooner if concerns arise.  Take care and seek immediate care sooner if you develop any concerns.   Dr. Mariacristina Aday

## 2024-07-02 NOTE — Assessment & Plan Note (Addendum)
 Concerns about cognitive impairment due to behavioral and memory changes. PHQ9 0. Abnormal Minicog. With some delusions. - Ordered lab work to look for reversible causes. - Scheduled follow-up in 2-4 weeks to review labs and assess cognitive function. - obtain full neuro exam and order MRI brain at that time.

## 2024-07-03 LAB — CBC WITH DIFFERENTIAL/PLATELET
Basophils Absolute: 0 x10E3/uL (ref 0.0–0.2)
Basos: 0 %
EOS (ABSOLUTE): 0.1 x10E3/uL (ref 0.0–0.4)
Eos: 1 %
Hematocrit: 41.1 % (ref 34.0–46.6)
Hemoglobin: 13.5 g/dL (ref 11.1–15.9)
Immature Grans (Abs): 0 x10E3/uL (ref 0.0–0.1)
Immature Granulocytes: 0 %
Lymphocytes Absolute: 2.3 x10E3/uL (ref 0.7–3.1)
Lymphs: 22 %
MCH: 30.1 pg (ref 26.6–33.0)
MCHC: 32.8 g/dL (ref 31.5–35.7)
MCV: 92 fL (ref 79–97)
Monocytes Absolute: 0.9 x10E3/uL (ref 0.1–0.9)
Monocytes: 8 %
Neutrophils Absolute: 6.9 x10E3/uL (ref 1.4–7.0)
Neutrophils: 69 %
Platelets: 335 x10E3/uL (ref 150–450)
RBC: 4.49 x10E6/uL (ref 3.77–5.28)
RDW: 12.2 % (ref 11.7–15.4)
WBC: 10.2 x10E3/uL (ref 3.4–10.8)

## 2024-07-03 LAB — COMPREHENSIVE METABOLIC PANEL WITH GFR
ALT: 11 IU/L (ref 0–32)
AST: 16 IU/L (ref 0–40)
Albumin: 4.5 g/dL (ref 3.9–4.9)
Alkaline Phosphatase: 110 IU/L (ref 49–135)
BUN/Creatinine Ratio: 10 — ABNORMAL LOW (ref 12–28)
BUN: 8 mg/dL (ref 8–27)
Bilirubin Total: 0.6 mg/dL (ref 0.0–1.2)
CO2: 22 mmol/L (ref 20–29)
Calcium: 9.2 mg/dL (ref 8.7–10.3)
Chloride: 101 mmol/L (ref 96–106)
Creatinine, Ser: 0.78 mg/dL (ref 0.57–1.00)
Globulin, Total: 2.8 g/dL (ref 1.5–4.5)
Glucose: 105 mg/dL — ABNORMAL HIGH (ref 70–99)
Potassium: 3.9 mmol/L (ref 3.5–5.2)
Sodium: 139 mmol/L (ref 134–144)
Total Protein: 7.3 g/dL (ref 6.0–8.5)
eGFR: 84 mL/min/1.73

## 2024-07-03 LAB — TSH: TSH: 1.35 u[IU]/mL (ref 0.450–4.500)

## 2024-07-03 LAB — HIV ANTIBODY (ROUTINE TESTING W REFLEX): HIV Screen 4th Generation wRfx: NONREACTIVE

## 2024-07-03 LAB — VITAMIN B12: Vitamin B-12: 458 pg/mL (ref 232–1245)

## 2024-07-03 LAB — SYPHILIS: RPR W/REFLEX TO RPR TITER AND TREPONEMAL ANTIBODIES, TRADITIONAL SCREENING AND DIAGNOSIS ALGORITHM: RPR Ser Ql: NONREACTIVE

## 2024-07-05 ENCOUNTER — Ambulatory Visit: Payer: Self-pay | Admitting: Family Medicine

## 2024-07-08 ENCOUNTER — Telehealth: Payer: Self-pay

## 2024-07-08 DIAGNOSIS — R4689 Other symptoms and signs involving appearance and behavior: Secondary | ICD-10-CM

## 2024-07-08 DIAGNOSIS — R4189 Other symptoms and signs involving cognitive functions and awareness: Secondary | ICD-10-CM

## 2024-07-08 NOTE — Telephone Encounter (Signed)
MR brain ordered.

## 2024-07-08 NOTE — Telephone Encounter (Signed)
 Daughter returns call to nurse line.   She is wanting to expedite evaluation as she is concerned that her mom is going to get arrested with her current delusions.   She reports that she has been starting fights and making statements such as the devil is my brother.   She states that her mother is not agreeable to coming into the office and is unsure how to proceed.   She is concerned about her and we discussed IVC process.   She is going to start this process within [facility] for patient to receive further evaluation.   Chiquita JAYSON English, RN

## 2024-07-08 NOTE — Telephone Encounter (Signed)
 Patient's daughter calls nurse line regarding cognitive concerns.   She states that child psychotherapist saw patient yesterday and agreed that patient should go ahead and receive MRI for delusions.   Daughter would like to go ahead and expedite getting MRI.   Phone number for social worker [phone].   Please advise if imaging order is appropriate or if patient should wait until follow up visit with Dr. Adele.   Chiquita JAYSON English, RN

## 2024-07-09 ENCOUNTER — Emergency Department (HOSPITAL_COMMUNITY)
Admission: EM | Admit: 2024-07-09 | Discharge: 2024-07-10 | Disposition: A | Attending: Emergency Medicine | Admitting: Emergency Medicine

## 2024-07-09 ENCOUNTER — Emergency Department (HOSPITAL_COMMUNITY)

## 2024-07-09 ENCOUNTER — Ambulatory Visit (HOSPITAL_COMMUNITY)
Admission: EM | Admit: 2024-07-09 | Discharge: 2024-07-09 | Disposition: A | Discharge status: Other Acute Inpatient Hospital | Attending: Psychiatry | Admitting: Psychiatry

## 2024-07-09 DIAGNOSIS — F29 Unspecified psychosis not due to a substance or known physiological condition: Secondary | ICD-10-CM | POA: Diagnosis present

## 2024-07-09 DIAGNOSIS — F1290 Cannabis use, unspecified, uncomplicated: Secondary | ICD-10-CM | POA: Insufficient documentation

## 2024-07-09 DIAGNOSIS — M1990 Unspecified osteoarthritis, unspecified site: Secondary | ICD-10-CM | POA: Insufficient documentation

## 2024-07-09 DIAGNOSIS — N3000 Acute cystitis without hematuria: Secondary | ICD-10-CM | POA: Diagnosis not present

## 2024-07-09 DIAGNOSIS — I10 Essential (primary) hypertension: Secondary | ICD-10-CM | POA: Insufficient documentation

## 2024-07-09 DIAGNOSIS — J45909 Unspecified asthma, uncomplicated: Secondary | ICD-10-CM | POA: Insufficient documentation

## 2024-07-09 DIAGNOSIS — Z79899 Other long term (current) drug therapy: Secondary | ICD-10-CM | POA: Insufficient documentation

## 2024-07-09 DIAGNOSIS — F22 Delusional disorders: Secondary | ICD-10-CM | POA: Insufficient documentation

## 2024-07-09 DIAGNOSIS — N390 Urinary tract infection, site not specified: Secondary | ICD-10-CM | POA: Insufficient documentation

## 2024-07-09 DIAGNOSIS — Z91410 Personal history of adult physical and sexual abuse: Secondary | ICD-10-CM | POA: Insufficient documentation

## 2024-07-09 DIAGNOSIS — R4182 Altered mental status, unspecified: Secondary | ICD-10-CM | POA: Insufficient documentation

## 2024-07-09 LAB — CBC WITH DIFFERENTIAL/PLATELET
Abs Immature Granulocytes: 0.05 K/uL (ref 0.00–0.07)
Basophils Absolute: 0 K/uL (ref 0.0–0.1)
Basophils Relative: 0 %
Eosinophils Absolute: 0.1 K/uL (ref 0.0–0.5)
Eosinophils Relative: 1 %
HCT: 38.5 % (ref 36.0–46.0)
Hemoglobin: 12.6 g/dL (ref 12.0–15.0)
Immature Granulocytes: 0 %
Lymphocytes Relative: 17 %
Lymphs Abs: 2.3 K/uL (ref 0.7–4.0)
MCH: 29.9 pg (ref 26.0–34.0)
MCHC: 32.7 g/dL (ref 30.0–36.0)
MCV: 91.4 fL (ref 80.0–100.0)
Monocytes Absolute: 0.9 K/uL (ref 0.1–1.0)
Monocytes Relative: 7 %
Neutro Abs: 10.2 K/uL — ABNORMAL HIGH (ref 1.7–7.7)
Neutrophils Relative %: 75 %
Platelets: 344 K/uL (ref 150–400)
RBC: 4.21 MIL/uL (ref 3.87–5.11)
RDW: 12.9 % (ref 11.5–15.5)
WBC: 13.6 K/uL — ABNORMAL HIGH (ref 4.0–10.5)
nRBC: 0 % (ref 0.0–0.2)

## 2024-07-09 LAB — LIPID PANEL
Cholesterol: 176 mg/dL (ref 0–200)
HDL: 43 mg/dL
LDL Cholesterol: 113 mg/dL — ABNORMAL HIGH (ref 0–99)
Total CHOL/HDL Ratio: 4.1 ratio
Triglycerides: 99 mg/dL
VLDL: 20 mg/dL (ref 0–40)

## 2024-07-09 LAB — POCT URINE DRUG SCREEN - MANUAL ENTRY (I-SCREEN)
POC Amphetamine UR: NOT DETECTED
POC Buprenorphine (BUP): NOT DETECTED
POC Cocaine UR: NOT DETECTED
POC Marijuana UR: POSITIVE — AB
POC Methadone UR: NOT DETECTED
POC Methamphetamine UR: NOT DETECTED
POC Morphine: NOT DETECTED
POC Oxazepam (BZO): NOT DETECTED
POC Oxycodone UR: NOT DETECTED
POC Secobarbital (BAR): NOT DETECTED

## 2024-07-09 LAB — URINALYSIS, ROUTINE W REFLEX MICROSCOPIC
Bilirubin Urine: NEGATIVE
Glucose, UA: NEGATIVE mg/dL
Ketones, ur: NEGATIVE mg/dL
Nitrite: NEGATIVE
Protein, ur: NEGATIVE mg/dL
RBC / HPF: >50 RBC/hpf (ref 0–5)
Specific Gravity, Urine: 1.003 — ABNORMAL LOW (ref 1.005–1.030)
WBC, UA: >50 WBC/hpf (ref 0–5)
pH: 5 (ref 5.0–8.0)

## 2024-07-09 LAB — COMPREHENSIVE METABOLIC PANEL WITH GFR
ALT: 13 U/L (ref 0–44)
AST: 17 U/L (ref 15–41)
Albumin: 4.5 g/dL (ref 3.5–5.0)
Alkaline Phosphatase: 125 U/L (ref 38–126)
Anion gap: 14 (ref 5–15)
BUN: 9 mg/dL (ref 8–23)
CO2: 24 mmol/L (ref 22–32)
Calcium: 9.5 mg/dL (ref 8.9–10.3)
Chloride: 94 mmol/L — ABNORMAL LOW (ref 98–111)
Creatinine, Ser: 0.77 mg/dL (ref 0.44–1.00)
GFR, Estimated: >60 mL/min
Glucose, Bld: 137 mg/dL — ABNORMAL HIGH (ref 70–99)
Potassium: 3.8 mmol/L (ref 3.5–5.1)
Sodium: 132 mmol/L — ABNORMAL LOW (ref 135–145)
Total Bilirubin: 0.7 mg/dL (ref 0.0–1.2)
Total Protein: 7.7 g/dL (ref 6.5–8.1)

## 2024-07-09 LAB — POC URINE PREG, ED: Preg Test, Ur: NEGATIVE

## 2024-07-09 LAB — HEMOGLOBIN A1C
Hgb A1c MFr Bld: 6.1 % — ABNORMAL HIGH (ref 4.8–5.6)
Mean Plasma Glucose: 128.37 mg/dL

## 2024-07-09 LAB — CBG MONITORING, ED: Glucose-Capillary: 130 mg/dL — ABNORMAL HIGH (ref 70–99)

## 2024-07-09 LAB — TOXASSURE SELECT 13 (MW), URINE

## 2024-07-09 LAB — MAGNESIUM: Magnesium: 2.1 mg/dL (ref 1.7–2.4)

## 2024-07-09 LAB — TSH: TSH: 1.55 u[IU]/mL (ref 0.350–4.500)

## 2024-07-09 LAB — ETHANOL: Alcohol, Ethyl (B): <15 mg/dL

## 2024-07-09 MED ORDER — VALSARTAN-HYDROCHLOROTHIAZIDE 80-12.5 MG PO TABS: 1.0000 | ORAL_TABLET | Freq: Every day | ORAL | Status: DC

## 2024-07-09 MED ORDER — MAGNESIUM HYDROXIDE 400 MG/5ML PO SUSP: 30.0000 mL | Freq: Every day | ORAL | Status: DC | PRN

## 2024-07-09 MED ORDER — OLANZAPINE 5 MG PO TBDP
5.0000 mg | ORAL_TABLET | Freq: Three times a day (TID) | ORAL | Status: DC | PRN
  Filled 2024-07-09: qty 1

## 2024-07-09 MED ORDER — SODIUM CHLORIDE 0.9 % IV SOLN
1.0000 g | Freq: Once | INTRAVENOUS | Status: AC
  Administered 2024-07-09: 1 g via INTRAVENOUS
  Filled 2024-07-09: qty 10

## 2024-07-09 MED ORDER — TRAZODONE HCL 50 MG PO TABS: 50.0000 mg | ORAL_TABLET | Freq: Every evening | ORAL | Status: DC | PRN

## 2024-07-09 MED ORDER — OLANZAPINE 10 MG IM SOLR: 5.0000 mg | Freq: Three times a day (TID) | INTRAMUSCULAR | Status: DC | PRN

## 2024-07-09 MED ORDER — ZIPRASIDONE MESYLATE 20 MG IM SOLR
10.0000 mg | Freq: Once | INTRAMUSCULAR | Status: AC
  Administered 2024-07-09: 10 mg via INTRAMUSCULAR
  Filled 2024-07-09: qty 20

## 2024-07-09 MED ORDER — IRBESARTAN 75 MG PO TABS: 75.0000 mg | ORAL_TABLET | Freq: Every day | ORAL | Status: DC

## 2024-07-09 MED ORDER — ACETAMINOPHEN 325 MG PO TABS
650.0000 mg | ORAL_TABLET | Freq: Once | ORAL | Status: AC
  Administered 2024-07-09: 650 mg via ORAL
  Filled 2024-07-09: qty 2

## 2024-07-09 MED ORDER — ACETAMINOPHEN 325 MG PO TABS: 650.0000 mg | ORAL_TABLET | Freq: Four times a day (QID) | ORAL | Status: DC | PRN

## 2024-07-09 MED ORDER — OLANZAPINE 10 MG IM SOLR: 10.0000 mg | Freq: Three times a day (TID) | INTRAMUSCULAR | Status: DC | PRN

## 2024-07-09 MED ORDER — ALUM & MAG HYDROXIDE-SIMETH 200-200-20 MG/5ML PO SUSP: 30.0000 mL | ORAL | Status: DC | PRN

## 2024-07-09 MED ORDER — HYDROXYZINE HCL 25 MG PO TABS: 25.0000 mg | ORAL_TABLET | Freq: Three times a day (TID) | ORAL | Status: DC | PRN

## 2024-07-09 MED ORDER — HYDROCHLOROTHIAZIDE 12.5 MG PO TABS: 12.5000 mg | ORAL_TABLET | Freq: Every day | ORAL | Status: DC

## 2024-07-09 MED ORDER — MONTELUKAST SODIUM 10 MG PO TABS: 10.0000 mg | ORAL_TABLET | Freq: Every day | ORAL | Status: DC

## 2024-07-09 MED ORDER — FLUTICASONE PROPIONATE 50 MCG/ACT NA SUSP: 2.0000 | Freq: Every day | NASAL | Status: DC

## 2024-07-09 NOTE — ED Notes (Signed)
 Patient came in IVC'd; First Exam completed by MD Dean; All paper work uploaded to chart and e file; 1 copy in red folder; 1 copy in medical records; 3 copies in blue zone where patient is located; Envelope # [id number] Case # is to be obtained by clerk of courts by Deloris. Documented in IVC Folder.
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
## 2024-07-09 NOTE — ED Notes (Signed)
 Patient transporting to [facility] via [employer] at this time. IVC paperwork and emtala provided. No s/s of current distress.

## 2024-07-09 NOTE — ED Notes (Signed)
 Report called to [facility].  Transportation arranged with [employer].
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
## 2024-07-09 NOTE — Discharge Instructions (Signed)
Pt transferred to [facility] °

## 2024-07-09 NOTE — ED Triage Notes (Signed)
 Pt BIB [facility] from [place]. Police state they were called out to bring her here. Pt is argumentative at this time.

## 2024-07-09 NOTE — ED Notes (Signed)
 Patient placed in obs unit. Patient presents tangential with confusion. Patient initially refusing to cooperate with admission process but eventually allowed staff to collect urine and lab work. Unable to obtain EKG due to machine malfunction. Patient provided with a sandwich, drink, and snacks. Patient appears more cooperative at this time, contacted her daughter, and is aware of pending transfer to ED.

## 2024-07-09 NOTE — ED Provider Notes (Incomplete)
 " [place] [facility] Provider Note   CSN: [id number] Arrival date & time: 07/09/24  8062     Patient presents with: No chief complaint on file.   Melissa Lloyd is a 69 y.o. female with a past medical history of HTN, asthma presents to Emergency Department via G[facility] from [facility] under IVC for evaluation of acute AMS. Family reports patient has had increased confusion since 06/24/24 and have discussed this with PCP who ordered MR brain imaging however patient refused imaging. Was evaluated at [facility] today who recommended ED evaluation for concern of acute encephalopathy 2/2 medical etiology.   During assessment, patient is agitated with pressured speech. She is speaking about being a child of God and that her brother is the devil. She also speaks about how she owns a home from [employer] and is worried regarding squatters in her home. She has been calling 911 frequently and appearing in other neighbors houses unwelcomed claiming it was her house. She denied suicidal ideation,self-harm, homicidal ideation, auditory hallucinations, visual hallucinations    HPI     Prior to Admission medications  Medication Sig Start Date End Date Taking? Authorizing Provider  albuterol  (VENTOLIN  HFA) 108 (90 Base) MCG/ACT inhaler Inhale 1-2 puffs into the lungs every 4 (four) hours as needed for wheezing or shortness of breath. 07/02/24   Madelon Donald CHRISTELLA, DO  fluticasone  (FLONASE ) 50 MCG/ACT nasal spray Place 2 sprays into both nostrils daily. Patient taking differently: Place 2 sprays into both nostrils daily as needed for allergies. 07/02/24   Rumball, Alison M, DO  montelukast  (SINGULAIR ) 10 MG tablet Take 1 tablet (10 mg total) by mouth daily. 07/02/24   Rumball, Alison M, DO  valsartan -hydrochlorothiazide  (DIOVAN  HCT) 80-12.5 MG tablet Take 1 tablet by mouth daily. 07/02/24   Rumball, Alison M, DO    Allergies: Patient has no known allergies.    Review of  Systems  Psychiatric/Behavioral:  Positive for agitation.     Updated Vital Signs BP (!) 179/90 (BP Location: Left Arm)   Pulse 84   Temp 98.3 F (36.8 C) (Oral)   Resp 17   LMP 07/19/2011   SpO2 100%   Physical Exam Vitals and nursing note reviewed.  Constitutional:      General: She is not in acute distress.    Appearance: Normal appearance.  HENT:     Head: Normocephalic and atraumatic.  Eyes:     General: No visual field deficit.    Conjunctiva/sclera: Conjunctivae normal.  Cardiovascular:     Rate and Rhythm: Normal rate.  Pulmonary:     Effort: Pulmonary effort is normal. No respiratory distress.  Abdominal:     Tenderness: There is no abdominal tenderness.  Skin:    Coloration: Skin is not jaundiced or pale.  Neurological:     Mental Status: She is alert and oriented to person, place, and time. Mental status is at baseline.     Cranial Nerves: No dysarthria or facial asymmetry.     Motor: No weakness.     Coordination: Coordination normal. Finger-Nose-Finger Test and Heel to [facility] Test normal.     Gait: Gait normal.     Comments: Motor 5/5 and sensation 2/2 of BUE and BLE.  Psychiatric:        Attention and Perception: She does not perceive auditory or visual hallucinations.        Mood and Affect: Affect is angry.        Speech:  Speech is rapid and pressured.        Behavior: Behavior is agitated.        Thought Content: Thought content does not include homicidal or suicidal ideation.     Comments: Does not appear to be responding to internal stimuli. Pressured speech. Agitated     (all labs ordered are listed, but only abnormal results are displayed) Labs Reviewed  CBG MONITORING, ED - Abnormal; Notable for the following components:      Result Value   Glucose-Capillary 130 (*)    All other components within normal limits    EKG: None  Radiology: No results found.   Medications Ordered in the ED  cefTRIAXone  (ROCEPHIN ) 1 g in sodium chloride  0.9  % 100 mL IVPB (1 g Intravenous New Bag/Given 07/09/24 2322)  ziprasidone  (GEODON ) injection 10 mg (10 mg Intramuscular Given 07/09/24 2217)  acetaminophen  (TYLENOL ) tablet 650 mg (650 mg Oral Given 07/09/24 2321)                                    Medical Decision Making Amount and/or Complexity of Data Reviewed Radiology: ordered.  Risk OTC drugs. Prescription drug management.   Patient presents to the ED for concern of AMS, this involves an extensive number of treatment options, and is a complaint that carries with it a high risk of complications and morbidity.  The differential diagnosis includes medication polypharmacy, drug toxidrome, encephalopathy 2/2 infection, UTI, psychosis   Co morbidities that complicate the patient evaluation  See HPI   Additional history obtained:  Additional history obtained from Nursing and Outside Medical Records   External records from outside source obtained and reviewed including triage RN note, [facility] note   Lab Tests:  I Ordered, and personally interpreted labs.  The pertinent results include:   CBG 137 UA with hgb, leuks, RBC, WBC, few bacteria   Imaging Studies ordered:  I ordered imaging studies including CT head, MR brain  Pending at sign out    Medicines ordered and prescription drug management:  I ordered medication including rocephin   for UTI  Reevaluation of the patient after these medicines showed that the patient stayed the same I have reviewed the patients home medicines and have made adjustments as needed     Problem List / ED Course:  AMS UTI VS notable for HTN of 179/90. No fever nor tachycardia She is able to answer questions appropriately however has tangential, rapid, and pressured speech.  Family report AMS since 06/24/24. Daughter, Ronal Gosling, placed patient under IVC No focal motor nor sensory deficits. Follows commands appropriately. No slurred speech. Patient denies visual disturbances, HA Labs  obtained by [facility] today notable for leukocytosis of 13.6. UA appears infected. CBG 137 Patient presents under IVC. She is agitated regarding being here, attempts to walk away from her bed, and is verbally aggressive when she does not get her way Attempted redirection, providing food, water however patient continues to remain aggressive. Provided geodon  for agitation, to complete ED workup Rocephin  in ED for UTI CT head and MR brain pending at sign out   Reevaluation:  After the interventions noted above, I reevaluated the patient and found that they have :stayed the same    Dispostion:  Patient currently under IVC  After consideration of the diagnostic results and the patients response to treatment, I feel that the patent would benefit from medicine admission for acute encephalopathy in  setting of UTI, CT and MRI imaging of brain. Sign out to Ubaldo High PA pending admission    Final diagnoses:  Urinary tract infection with hematuria, site unspecified  Altered mental status, unspecified altered mental status type    ED Discharge Orders     None        Minnie Tinnie BRAVO, PA 07/10/24 0020    Minnie Tinnie BRAVO, PA 07/10/24 9955    Dean Clarity, MD 07/10/24 1744  "

## 2024-07-10 ENCOUNTER — Ambulatory Visit (HOSPITAL_COMMUNITY)
Admission: EM | Admit: 2024-07-10 | Discharge: 2024-07-10 | Disposition: A | Discharge status: Home / Self Care | Attending: Nurse Practitioner | Admitting: Nurse Practitioner

## 2024-07-10 ENCOUNTER — Other Ambulatory Visit: Payer: Self-pay

## 2024-07-10 ENCOUNTER — Emergency Department (HOSPITAL_COMMUNITY)

## 2024-07-10 DIAGNOSIS — R4182 Altered mental status, unspecified: Secondary | ICD-10-CM | POA: Insufficient documentation

## 2024-07-10 DIAGNOSIS — R410 Disorientation, unspecified: Secondary | ICD-10-CM

## 2024-07-10 DIAGNOSIS — F22 Delusional disorders: Secondary | ICD-10-CM | POA: Insufficient documentation

## 2024-07-10 DIAGNOSIS — N3000 Acute cystitis without hematuria: Secondary | ICD-10-CM | POA: Insufficient documentation

## 2024-07-10 MED ORDER — HYDROCHLOROTHIAZIDE 12.5 MG PO TABS: 12.5000 mg | ORAL_TABLET | Freq: Every day | ORAL | Status: DC

## 2024-07-10 MED ORDER — ACETAMINOPHEN 325 MG PO TABS
650.0000 mg | ORAL_TABLET | Freq: Four times a day (QID) | ORAL | Status: DC | PRN
  Administered 2024-07-10: 650 mg via ORAL
  Filled 2024-07-10: qty 2

## 2024-07-10 MED ORDER — ALBUTEROL SULFATE HFA 108 (90 BASE) MCG/ACT IN AERS: 1.0000 | INHALATION_SPRAY | RESPIRATORY_TRACT | Status: DC | PRN

## 2024-07-10 MED ORDER — MONTELUKAST SODIUM 10 MG PO TABS
10.0000 mg | ORAL_TABLET | Freq: Every day | ORAL | Status: DC
  Administered 2024-07-10: 10 mg via ORAL
  Filled 2024-07-10: qty 1

## 2024-07-10 MED ORDER — CEPHALEXIN 250 MG PO CAPS
500.0000 mg | ORAL_CAPSULE | Freq: Two times a day (BID) | ORAL | Status: DC
  Administered 2024-07-10: 500 mg via ORAL
  Filled 2024-07-10: qty 1
  Filled 2024-07-10: qty 2
  Filled 2024-07-10: qty 1

## 2024-07-10 MED ORDER — IRBESARTAN 75 MG PO TABS
75.0000 mg | ORAL_TABLET | Freq: Every day | ORAL | Status: DC
  Administered 2024-07-10: 75 mg via ORAL
  Filled 2024-07-10: qty 1

## 2024-07-10 MED ORDER — TRAZODONE HCL 50 MG PO TABS: 50.0000 mg | ORAL_TABLET | Freq: Every evening | ORAL | Status: DC | PRN

## 2024-07-10 MED ORDER — CEPHALEXIN 500 MG PO CAPS: 500.0000 mg | ORAL_CAPSULE | Freq: Two times a day (BID) | ORAL | 0 refills | Status: DC

## 2024-07-10 MED ORDER — OLANZAPINE 5 MG PO TBDP: 5.0000 mg | ORAL_TABLET | Freq: Three times a day (TID) | ORAL | Status: DC | PRN

## 2024-07-10 MED ORDER — VALSARTAN-HYDROCHLOROTHIAZIDE 80-12.5 MG PO TABS: 1.0000 | ORAL_TABLET | Freq: Every day | ORAL | Status: DC

## 2024-07-10 MED ORDER — ALUM & MAG HYDROXIDE-SIMETH 200-200-20 MG/5ML PO SUSP: 30.0000 mL | ORAL | Status: DC | PRN

## 2024-07-10 MED ORDER — CEPHALEXIN 500 MG PO CAPS: 500.0000 mg | ORAL_CAPSULE | Freq: Two times a day (BID) | ORAL | 0 refills | Status: AC

## 2024-07-10 MED ORDER — MAGNESIUM HYDROXIDE 400 MG/5ML PO SUSP: 30.0000 mL | Freq: Every day | ORAL | Status: DC | PRN

## 2024-07-10 MED ORDER — OLANZAPINE 10 MG IM SOLR: 5.0000 mg | Freq: Three times a day (TID) | INTRAMUSCULAR | Status: DC | PRN

## 2024-07-10 NOTE — Progress Notes (Signed)
 Psych Rapid Response Call  Time Initiated: 07/09/24 @2020  (approx)  Reason for Activation: Pt verbally aggressive towards staff; threatening physical aggression; security called as well  Intervention(s) Initiated by Psychiatric Nurse Liaison: Pt here for med clearance from [facility] under IVC for acute AMS; PNL attempted to de-escalate pt who was refusing agitation medication. Pt calmed down but refused all medical interventions. Stayed with pt along with engineer, materials. Pt asked to see provider because she wanted to leave ED. Educated pt on IVC status.   Patient Response to Intervention(s): Contacted EDP who came to see pt at pt's request. Pt willing to take IM medication and consented to CT without further incident. Emotional support given to pt. Sitter now with pt as she awaits diagnostic testing.  Time Resolved:  07/09/24 @ 2230  Loetta Pinal, RN, BSN, RN-BC

## 2024-07-10 NOTE — ED Provider Notes (Signed)
" °  Physical Exam  BP (!) 119/50 (BP Location: Right Arm)   Pulse 75   Temp 97.7 F (36.5 C) (Oral)   Resp 16   LMP 07/19/2011   SpO2 100%   Physical Exam  Procedures  Procedures  ED Course / MDM    Medical Decision Making Amount and/or Complexity of Data Reviewed Radiology: ordered.  Risk OTC drugs. Prescription drug management.   Patient care assumed at shift handoff from previous provider.  Please see her note for full details.In short, patient with worsening mental status over the past few weeks, concerning for possible psychosis.  Patient IVC by her daughter.  Patient initially sent for medical clearance to rule out encephalopathy.  Workup reassuring other than signs of possible urinary tract infection.  Patient treated with Rocephin  and will be treated with Keflex  as an outpatient.  Urine culture sent.  CT head unremarkable.  At this time this seems more consistent with a psychosis then with an altered mental status/encephalopathy.  If this is due to an underlying urinary tract infection it should improve with antibiotics.  Discussed with NP at behavioral urgent care,Shalon Bobbitt, who agrees with transfer back to their facility for further mental health care.       Logan Ubaldo NOVAK, PA-C 07/10/24 0142  "

## 2024-07-10 NOTE — ED Notes (Signed)
 Patient A&O x4. Cooperative with intake process. Presents of increasing delusions of owning multiples homes. Oriented to [facility]. Monitor for safety.

## 2024-07-10 NOTE — Discharge Instructions (Signed)
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
## 2024-07-21 ENCOUNTER — Telehealth: Payer: Self-pay

## 2024-07-21 NOTE — Telephone Encounter (Signed)
 Patients daughter calls nurse line in regards to her mother.   She reports she had her involuntarily committed on 1/24. However, she reports she was only held for ~ 1 day.   She reports she was arrested on 2/1 and is currently still in jail.   Advised it appears a MRI was ordered.   Will forward to PCP and referral coordinator.   Patients next PCP apt is 3/3.

## 2024-08-03 ENCOUNTER — Ambulatory Visit: Payer: Self-pay | Admitting: Family Medicine

## 2024-08-17 ENCOUNTER — Ambulatory Visit: Admitting: Family Medicine
